# Patient Record
Sex: Female | Born: 1985 | Hispanic: No | Marital: Single | Smoking: Never smoker
Health system: Southern US, Community
[De-identification: ages and names within clinical notes are randomized; demographics above are authoritative.]

## PROBLEM LIST (undated history)

## (undated) DIAGNOSIS — I10 Essential (primary) hypertension: Secondary | ICD-10-CM

## (undated) DIAGNOSIS — D649 Anemia, unspecified: Secondary | ICD-10-CM

## (undated) NOTE — *Deleted (*Deleted)
   09/12/20 0830  Clinical Encounter Type  Visited With Family  Visit Type Follow-up  Referral From Nurse  Consult/Referral To Chaplain  Chaplain called to support the patient's family in the waiting area. Patient's fiance, Justice and sister, Brittany sitting in waiting area. Brittany crying, stated the doctor gave bad news about the patient's condition. Brittany is not accepting that the patient may not survive. Chaplain offered prayer and comfort.This note was prepared by Chaplain Gail Brown, M.Div..  For questions please contact by phone [phone].  

## (undated) NOTE — *Deleted (*Deleted)
NAME:  Glenda Fuller, MRN:  [id number], DOB:  [birth year], LOS: 0 ADMISSION DATE:  09-21-2020, CONSULTATION DATE:  *** REFERRING MD:  ***, CHIEF COMPLAINT:  ***   Brief History   ***  History of present illness   ***  Past Medical History  ***  Significant Hospital Events   ***  Consults:  ***  Procedures:  ***  Significant Diagnostic Tests:  ***  Micro Data:  ***  Antimicrobials:  ***   Interim history/subjective:  ***  Objective   Blood pressure (!) 202/96, pulse 79, temperature 98.6 F (37 C), temperature source Oral, resp. rate 13, height 5' (1.524 m), weight 39.1 kg, last menstrual period 07/30/2020, SpO2 100 %.        Intake/Output Summary (Last 24 hours) at 09/21/20 2046 Last data filed at September 21, 2020 1826 Gross per 24 hour  Intake 1001.6 ml  Output -  Net 1001.6 ml   Filed Weights   2020/09/21 1508  Weight: 39.1 kg    Examination: General: *** HENT: *** Lungs: *** Cardiovascular: *** Abdomen: *** Extremities: *** Neuro: *** GU: ***  Resolved Hospital Problem list   ***  Assessment & Plan:  ***  Best practice:  Diet: *** Pain/Anxiety/Delirium protocol (if indicated): *** VAP protocol (if indicated): *** DVT prophylaxis: *** GI prophylaxis: *** Glucose control: *** Mobility: *** Code Status: *** Family Communication: *** Disposition:   Labs   CBC: Recent Labs  Lab 09-21-2020 1506  WBC 9.0  HGB 5.4*  HCT 16.1*  MCV 86.1  PLT 237    Basic Metabolic Panel: Recent Labs  Lab Sep 21, 2020 1506  NA 131*  K 3.3*  CL 94*  CO2 17*  GLUCOSE 114*  BUN 118*  CREATININE 10.12*  CALCIUM 9.1   GFR: Estimated Creatinine Clearance: 4.8 mL/min (A) (by C-G formula based on SCr of 10.12 mg/dL (H)). Recent Labs  Lab 09/21/2020 1506  WBC 9.0    Liver Function Tests: Recent Labs  Lab September 21, 2020 1506  AST 18  ALT 35  ALKPHOS 96  BILITOT 1.0  PROT 7.2  ALBUMIN 3.3*   Recent Labs  Lab 09-21-20 1506  LIPASE 57*   No  results for input(s): AMMONIA in the last 168 hours.  ABG No results found for: PHART, PCO2ART, PO2ART, HCO3, TCO2, ACIDBASEDEF, O2SAT   Coagulation Profile: No results for input(s): INR, PROTIME in the last 168 hours.  Cardiac Enzymes: No results for input(s): CKTOTAL, CKMB, CKMBINDEX, TROPONINI in the last 168 hours.  HbA1C: No results found for: HGBA1C  CBG: No results for input(s): GLUCAP in the last 168 hours.  Review of Systems:   ***  Past Medical History  She,  has a past medical history of Anemia and Hypertension.   Surgical History   History reviewed. No pertinent surgical history.   Social History   reports that she has never smoked. She does not have any smokeless tobacco history on file. She reports previous alcohol use. She reports previous drug use.   Family History   Her family history is not on file.   Allergies Not on File   Home Medications  Prior to Admission medications   Medication Sig Start Date End Date Taking? Authorizing Provider  amLODipine (NORVASC) 5 MG tablet Take by mouth. 09/21/2020 10/09/20  [provider]  ferrous sulfate 325 (65 FE) MG tablet Take by mouth.    [provider]  methocarbamol (ROBAXIN) 500 MG tablet Take by mouth. 09-21-2020 09/16/20  [provider]  nabumetone (  RELAFEN) 750 MG tablet Take by mouth. 2020-10-06 09/23/20  [provider]  traMADol Janean Sark) 50 MG tablet Take by mouth. 10-06-20 09/14/20  [provider]     Critical care time: ***

---

## 2020-09-09 ENCOUNTER — Other Ambulatory Visit: Payer: Self-pay

## 2020-09-09 ENCOUNTER — Emergency Department (HOSPITAL_BASED_OUTPATIENT_CLINIC_OR_DEPARTMENT_OTHER): Payer: Medicaid Other

## 2020-09-09 ENCOUNTER — Encounter (HOSPITAL_COMMUNITY): Admission: EM | Disposition: E | Payer: Self-pay | Source: Home / Self Care | Attending: Pulmonary Disease

## 2020-09-09 ENCOUNTER — Encounter (HOSPITAL_BASED_OUTPATIENT_CLINIC_OR_DEPARTMENT_OTHER): Payer: Self-pay | Admitting: Emergency Medicine

## 2020-09-09 ENCOUNTER — Inpatient Hospital Stay (HOSPITAL_COMMUNITY): Payer: Medicaid Other

## 2020-09-09 ENCOUNTER — Inpatient Hospital Stay (HOSPITAL_BASED_OUTPATIENT_CLINIC_OR_DEPARTMENT_OTHER)
Admission: EM | Admit: 2020-09-09 | Discharge: 2020-09-25 | DRG: 219 | Disposition: E | Payer: Medicaid Other | Source: Ambulatory Visit | Attending: Pulmonary Disease | Admitting: Pulmonary Disease

## 2020-09-09 ENCOUNTER — Inpatient Hospital Stay (HOSPITAL_COMMUNITY): Payer: Medicaid Other | Admitting: Anesthesiology

## 2020-09-09 DIAGNOSIS — E871 Hypo-osmolality and hyponatremia: Secondary | ICD-10-CM | POA: Diagnosis not present

## 2020-09-09 DIAGNOSIS — Z4659 Encounter for fitting and adjustment of other gastrointestinal appliance and device: Secondary | ICD-10-CM

## 2020-09-09 DIAGNOSIS — D649 Anemia, unspecified: Secondary | ICD-10-CM | POA: Diagnosis present

## 2020-09-09 DIAGNOSIS — K661 Hemoperitoneum: Secondary | ICD-10-CM | POA: Diagnosis present

## 2020-09-09 DIAGNOSIS — Z66 Do not resuscitate: Secondary | ICD-10-CM | POA: Diagnosis not present

## 2020-09-09 DIAGNOSIS — T465X6A Underdosing of other antihypertensive drugs, initial encounter: Secondary | ICD-10-CM | POA: Diagnosis present

## 2020-09-09 DIAGNOSIS — I7101 Dissection of thoracic aorta: Principal | ICD-10-CM | POA: Diagnosis present

## 2020-09-09 DIAGNOSIS — G9389 Other specified disorders of brain: Secondary | ICD-10-CM | POA: Diagnosis not present

## 2020-09-09 DIAGNOSIS — E781 Pure hyperglyceridemia: Secondary | ICD-10-CM | POA: Diagnosis not present

## 2020-09-09 DIAGNOSIS — I161 Hypertensive emergency: Secondary | ICD-10-CM | POA: Diagnosis present

## 2020-09-09 DIAGNOSIS — R739 Hyperglycemia, unspecified: Secondary | ICD-10-CM | POA: Diagnosis not present

## 2020-09-09 DIAGNOSIS — R140 Abdominal distension (gaseous): Secondary | ICD-10-CM | POA: Diagnosis not present

## 2020-09-09 DIAGNOSIS — I609 Nontraumatic subarachnoid hemorrhage, unspecified: Secondary | ICD-10-CM | POA: Diagnosis not present

## 2020-09-09 DIAGNOSIS — R778 Other specified abnormalities of plasma proteins: Secondary | ICD-10-CM | POA: Diagnosis present

## 2020-09-09 DIAGNOSIS — Z515 Encounter for palliative care: Secondary | ICD-10-CM | POA: Diagnosis not present

## 2020-09-09 DIAGNOSIS — Z91138 Patient's unintentional underdosing of medication regimen for other reason: Secondary | ICD-10-CM

## 2020-09-09 DIAGNOSIS — I7103 Dissection of thoracoabdominal aorta: Secondary | ICD-10-CM | POA: Diagnosis not present

## 2020-09-09 DIAGNOSIS — N170 Acute kidney failure with tubular necrosis: Secondary | ICD-10-CM | POA: Diagnosis not present

## 2020-09-09 DIAGNOSIS — I615 Nontraumatic intracerebral hemorrhage, intraventricular: Secondary | ICD-10-CM | POA: Diagnosis not present

## 2020-09-09 DIAGNOSIS — D62 Acute posthemorrhagic anemia: Secondary | ICD-10-CM | POA: Diagnosis present

## 2020-09-09 DIAGNOSIS — R34 Anuria and oliguria: Secondary | ICD-10-CM | POA: Diagnosis not present

## 2020-09-09 DIAGNOSIS — I614 Nontraumatic intracerebral hemorrhage in cerebellum: Secondary | ICD-10-CM | POA: Diagnosis not present

## 2020-09-09 DIAGNOSIS — M7981 Nontraumatic hematoma of soft tissue: Secondary | ICD-10-CM | POA: Diagnosis present

## 2020-09-09 DIAGNOSIS — Z95828 Presence of other vascular implants and grafts: Secondary | ICD-10-CM | POA: Diagnosis not present

## 2020-09-09 DIAGNOSIS — E872 Acidosis: Secondary | ICD-10-CM | POA: Diagnosis not present

## 2020-09-09 DIAGNOSIS — R578 Other shock: Secondary | ICD-10-CM | POA: Diagnosis not present

## 2020-09-09 DIAGNOSIS — J90 Pleural effusion, not elsewhere classified: Secondary | ICD-10-CM | POA: Diagnosis not present

## 2020-09-09 DIAGNOSIS — G96810 Intracranial hypotension, unspecified: Secondary | ICD-10-CM | POA: Diagnosis not present

## 2020-09-09 DIAGNOSIS — G911 Obstructive hydrocephalus: Secondary | ICD-10-CM | POA: Diagnosis not present

## 2020-09-09 DIAGNOSIS — Z978 Presence of other specified devices: Secondary | ICD-10-CM | POA: Diagnosis not present

## 2020-09-09 DIAGNOSIS — G9349 Other encephalopathy: Secondary | ICD-10-CM | POA: Diagnosis not present

## 2020-09-09 DIAGNOSIS — Z781 Physical restraint status: Secondary | ICD-10-CM

## 2020-09-09 DIAGNOSIS — M79A3 Nontraumatic compartment syndrome of abdomen: Secondary | ICD-10-CM | POA: Diagnosis not present

## 2020-09-09 DIAGNOSIS — Z7189 Other specified counseling: Secondary | ICD-10-CM | POA: Diagnosis not present

## 2020-09-09 DIAGNOSIS — N179 Acute kidney failure, unspecified: Secondary | ICD-10-CM | POA: Diagnosis not present

## 2020-09-09 DIAGNOSIS — I9789 Other postprocedural complications and disorders of the circulatory system, not elsewhere classified: Secondary | ICD-10-CM | POA: Diagnosis not present

## 2020-09-09 DIAGNOSIS — G936 Cerebral edema: Secondary | ICD-10-CM | POA: Diagnosis not present

## 2020-09-09 DIAGNOSIS — R4182 Altered mental status, unspecified: Secondary | ICD-10-CM | POA: Diagnosis not present

## 2020-09-09 DIAGNOSIS — Z0189 Encounter for other specified special examinations: Secondary | ICD-10-CM

## 2020-09-09 DIAGNOSIS — Z452 Encounter for adjustment and management of vascular access device: Secondary | ICD-10-CM

## 2020-09-09 DIAGNOSIS — R188 Other ascites: Secondary | ICD-10-CM | POA: Diagnosis not present

## 2020-09-09 DIAGNOSIS — J9601 Acute respiratory failure with hypoxia: Secondary | ICD-10-CM | POA: Diagnosis not present

## 2020-09-09 DIAGNOSIS — N19 Unspecified kidney failure: Secondary | ICD-10-CM

## 2020-09-09 DIAGNOSIS — I6200 Nontraumatic subdural hemorrhage, unspecified: Secondary | ICD-10-CM | POA: Diagnosis not present

## 2020-09-09 DIAGNOSIS — Z20822 Contact with and (suspected) exposure to covid-19: Secondary | ICD-10-CM | POA: Diagnosis present

## 2020-09-09 DIAGNOSIS — G8220 Paraplegia, unspecified: Secondary | ICD-10-CM | POA: Diagnosis not present

## 2020-09-09 DIAGNOSIS — Z01818 Encounter for other preprocedural examination: Secondary | ICD-10-CM

## 2020-09-09 DIAGNOSIS — G9511 Acute infarction of spinal cord (embolic) (nonembolic): Secondary | ICD-10-CM | POA: Diagnosis not present

## 2020-09-09 DIAGNOSIS — E876 Hypokalemia: Secondary | ICD-10-CM | POA: Diagnosis present

## 2020-09-09 DIAGNOSIS — Z8249 Family history of ischemic heart disease and other diseases of the circulatory system: Secondary | ICD-10-CM

## 2020-09-09 DIAGNOSIS — H5704 Mydriasis: Secondary | ICD-10-CM | POA: Diagnosis not present

## 2020-09-09 DIAGNOSIS — I619 Nontraumatic intracerebral hemorrhage, unspecified: Secondary | ICD-10-CM | POA: Diagnosis not present

## 2020-09-09 DIAGNOSIS — E162 Hypoglycemia, unspecified: Secondary | ICD-10-CM | POA: Diagnosis not present

## 2020-09-09 DIAGNOSIS — I10 Essential (primary) hypertension: Secondary | ICD-10-CM | POA: Diagnosis present

## 2020-09-09 DIAGNOSIS — I712 Thoracic aortic aneurysm, without rupture: Secondary | ICD-10-CM | POA: Diagnosis not present

## 2020-09-09 HISTORY — DX: Essential (primary) hypertension: I10

## 2020-09-09 HISTORY — DX: Anemia, unspecified: D64.9

## 2020-09-09 HISTORY — PX: THORACIC AORTIC ENDOVASCULAR STENT GRAFT: SHX6112

## 2020-09-09 LAB — CBC
HCT: 16.1 % — ABNORMAL LOW (ref 36.0–46.0)
Hemoglobin: 5.4 g/dL — CL (ref 12.0–15.0)
MCH: 28.9 pg (ref 26.0–34.0)
MCHC: 33.5 g/dL (ref 30.0–36.0)
MCV: 86.1 fL (ref 80.0–100.0)
Platelets: 237 10*3/uL (ref 150–400)
RBC: 1.87 MIL/uL — ABNORMAL LOW (ref 3.87–5.11)
RDW: 15.4 % (ref 11.5–15.5)
WBC: 9 10*3/uL (ref 4.0–10.5)
nRBC: 0 % (ref 0.0–0.2)

## 2020-09-09 LAB — URINALYSIS, ROUTINE W REFLEX MICROSCOPIC
Bilirubin Urine: NEGATIVE
Glucose, UA: NEGATIVE mg/dL
Ketones, ur: NEGATIVE mg/dL
Leukocytes,Ua: NEGATIVE
Nitrite: NEGATIVE
Protein, ur: 30 mg/dL — AB
Specific Gravity, Urine: 1.02 (ref 1.005–1.030)
pH: 5.5 (ref 5.0–8.0)

## 2020-09-09 LAB — PREGNANCY, URINE: Preg Test, Ur: NEGATIVE

## 2020-09-09 LAB — HEPATIC FUNCTION PANEL
ALT: 35 U/L (ref 0–44)
AST: 18 U/L (ref 15–41)
Albumin: 3.3 g/dL — ABNORMAL LOW (ref 3.5–5.0)
Alkaline Phosphatase: 96 U/L (ref 38–126)
Bilirubin, Direct: <0.1 mg/dL (ref 0.0–0.2)
Total Bilirubin: 1 mg/dL (ref 0.3–1.2)
Total Protein: 7.2 g/dL (ref 6.5–8.1)

## 2020-09-09 LAB — BASIC METABOLIC PANEL
Anion gap: 20 — ABNORMAL HIGH (ref 5–15)
BUN: 118 mg/dL — ABNORMAL HIGH (ref 6–20)
CO2: 17 mmol/L — ABNORMAL LOW (ref 22–32)
Calcium: 9.1 mg/dL (ref 8.9–10.3)
Chloride: 94 mmol/L — ABNORMAL LOW (ref 98–111)
Creatinine, Ser: 10.12 mg/dL — ABNORMAL HIGH (ref 0.44–1.00)
GFR, Estimated: 4 mL/min — ABNORMAL LOW (ref >60)
Glucose, Bld: 114 mg/dL — ABNORMAL HIGH (ref 70–99)
Potassium: 3.3 mmol/L — ABNORMAL LOW (ref 3.5–5.1)
Sodium: 131 mmol/L — ABNORMAL LOW (ref 135–145)

## 2020-09-09 LAB — URINALYSIS, MICROSCOPIC (REFLEX)

## 2020-09-09 LAB — OCCULT BLOOD X 1 CARD TO LAB, STOOL: Fecal Occult Bld: NEGATIVE

## 2020-09-09 LAB — PREPARE RBC (CROSSMATCH)

## 2020-09-09 LAB — RETICULOCYTES
Immature Retic Fract: 22.1 % — ABNORMAL HIGH (ref 2.3–15.9)
RBC.: 1.94 MIL/uL — ABNORMAL LOW (ref 3.87–5.11)
Retic Count, Absolute: 55.7 10*3/uL (ref 19.0–186.0)
Retic Ct Pct: 2.8 % (ref 0.4–3.1)

## 2020-09-09 LAB — LIPASE, BLOOD: Lipase: 57 U/L — ABNORMAL HIGH (ref 11–51)

## 2020-09-09 LAB — ABO/RH: ABO/RH(D): B POS

## 2020-09-09 LAB — RESPIRATORY PANEL BY RT PCR (FLU A&B, COVID)
Influenza A by PCR: NEGATIVE
Influenza B by PCR: NEGATIVE
SARS Coronavirus 2 by RT PCR: NEGATIVE

## 2020-09-09 LAB — TROPONIN I (HIGH SENSITIVITY)
Troponin I (High Sensitivity): 338 ng/L (ref <18)
Troponin I (High Sensitivity): 319 ng/L (ref <18)

## 2020-09-09 SURGERY — INSERTION, ENDOVASCULAR STENT GRAFT, AORTA, THORACIC
Anesthesia: General

## 2020-09-09 MED ORDER — FENTANYL CITRATE (PF) 100 MCG/2ML IJ SOLN: 25.0000 ug | INTRAMUSCULAR | Status: DC | PRN

## 2020-09-09 MED ORDER — PROMETHAZINE HCL 25 MG/ML IJ SOLN
6.2500 mg | INTRAMUSCULAR | Status: AC | PRN
  Administered 2020-09-10: 6.25 mg via INTRAVENOUS
  Administered 2020-09-10: 12.5 mg via INTRAVENOUS

## 2020-09-09 MED ORDER — CLEVIDIPINE BUTYRATE 0.5 MG/ML IV EMUL
0.0000 mg/h | INTRAVENOUS | Status: DC
  Administered 2020-09-09: 18 mg/h via INTRAVENOUS
  Administered 2020-09-10: 21 mg/h via INTRAVENOUS
  Administered 2020-09-10: 16 mg/h via INTRAVENOUS
  Administered 2020-09-10 (×3): 21 mg/h via INTRAVENOUS
  Administered 2020-09-10: 14 mg/h via INTRAVENOUS
  Administered 2020-09-10 (×2): 21 mg/h via INTRAVENOUS
  Administered 2020-09-10: 20 mg/h via INTRAVENOUS
  Administered 2020-09-10: 21 mg/h via INTRAVENOUS
  Administered 2020-09-11: 12 mg/h via INTRAVENOUS
  Administered 2020-09-11: 21 mg/h via INTRAVENOUS
  Filled 2020-09-09: qty 100
  Filled 2020-09-09: qty 50
  Filled 2020-09-09 (×9): qty 100
  Filled 2020-09-09: qty 50
  Filled 2020-09-09 (×3): qty 100
  Filled 2020-09-09: qty 50
  Filled 2020-09-09: qty 100

## 2020-09-09 MED ORDER — LABETALOL HCL 5 MG/ML IV SOLN
5.0000 mg | Freq: Once | INTRAVENOUS | Status: AC
  Administered 2020-09-09: 5 mg via INTRAVENOUS
  Filled 2020-09-09: qty 4

## 2020-09-09 MED ORDER — INSULIN REGULAR(HUMAN) IN NACL 100-0.9 UT/100ML-% IV SOLN
INTRAVENOUS | Status: DC
  Filled 2020-09-09: qty 100

## 2020-09-09 MED ORDER — MANNITOL 20 % IV SOLN
Freq: Once | INTRAVENOUS | Status: DC
  Filled 2020-09-09: qty 13

## 2020-09-09 MED ORDER — SODIUM CHLORIDE 0.9 % IV SOLN
INTRAVENOUS | Status: DC | PRN
  Administered 2020-09-09: 500 mL

## 2020-09-09 MED ORDER — IODIXANOL 320 MG/ML IV SOLN
INTRAVENOUS | Status: DC | PRN
  Administered 2020-09-09: 25 mL via INTRA_ARTERIAL

## 2020-09-09 MED ORDER — TRANEXAMIC ACID 1000 MG/10ML IV SOLN
1.5000 mg/kg/h | INTRAVENOUS | Status: DC
  Filled 2020-09-09: qty 25

## 2020-09-09 MED ORDER — DOCUSATE SODIUM 100 MG PO CAPS: 100.0000 mg | ORAL_CAPSULE | Freq: Two times a day (BID) | ORAL | Status: DC | PRN

## 2020-09-09 MED ORDER — POTASSIUM CHLORIDE 2 MEQ/ML IV SOLN
80.0000 meq | INTRAVENOUS | Status: DC
  Filled 2020-09-09: qty 40

## 2020-09-09 MED ORDER — HEPARIN SODIUM (PORCINE) 1000 UNIT/ML IJ SOLN
INTRAMUSCULAR | Status: DC | PRN
  Administered 2020-09-09: 2000 [IU] via INTRAVENOUS
  Administered 2020-09-10: 4000 [IU] via INTRAVENOUS

## 2020-09-09 MED ORDER — ONDANSETRON HCL 4 MG/2ML IJ SOLN
INTRAMUSCULAR | Status: AC
  Filled 2020-09-09: qty 2

## 2020-09-09 MED ORDER — HYDROMORPHONE HCL 1 MG/ML IJ SOLN: 0.2500 mg | INTRAMUSCULAR | Status: DC | PRN

## 2020-09-09 MED ORDER — PHENYLEPHRINE HCL-NACL 20-0.9 MG/250ML-% IV SOLN
30.0000 ug/min | INTRAVENOUS | Status: DC
  Filled 2020-09-09: qty 250

## 2020-09-09 MED ORDER — DEXMEDETOMIDINE HCL IN NACL 400 MCG/100ML IV SOLN
0.1000 ug/kg/h | INTRAVENOUS | Status: DC
  Filled 2020-09-09: qty 100

## 2020-09-09 MED ORDER — SODIUM CHLORIDE 0.9 % IV SOLN
750.0000 mg | INTRAVENOUS | Status: DC
  Filled 2020-09-09 (×2): qty 750

## 2020-09-09 MED ORDER — PROPOFOL 10 MG/ML IV BOLUS
INTRAVENOUS | Status: DC | PRN
  Administered 2020-09-09: 90 mg via INTRAVENOUS

## 2020-09-09 MED ORDER — ESMOLOL HCL-SODIUM CHLORIDE 2000 MG/100ML IV SOLN
25.0000 ug/kg/min | INTRAVENOUS | Status: DC
  Administered 2020-09-09: 25 ug/kg/min via INTRAVENOUS
  Administered 2020-09-10: 200 ug/kg/min via INTRAVENOUS
  Administered 2020-09-10: 75 ug/kg/min via INTRAVENOUS
  Filled 2020-09-09 (×5): qty 100

## 2020-09-09 MED ORDER — SODIUM CHLORIDE 0.9 % IV SOLN
INTRAVENOUS | Status: DC
  Filled 2020-09-09: qty 30

## 2020-09-09 MED ORDER — NITROGLYCERIN IN D5W 200-5 MCG/ML-% IV SOLN
2.0000 ug/min | INTRAVENOUS | Status: DC
  Filled 2020-09-09: qty 250

## 2020-09-09 MED ORDER — HYDROMORPHONE HCL 1 MG/ML IJ SOLN: 1.0000 mg | Freq: Once | INTRAMUSCULAR | Status: AC

## 2020-09-09 MED ORDER — EPINEPHRINE HCL 5 MG/250ML IV SOLN IN NS
0.0000 ug/min | INTRAVENOUS | Status: DC
  Filled 2020-09-09: qty 250

## 2020-09-09 MED ORDER — LIDOCAINE 2% (20 MG/ML) 5 ML SYRINGE
INTRAMUSCULAR | Status: DC | PRN
  Administered 2020-09-09: 20 mg via INTRAVENOUS

## 2020-09-09 MED ORDER — CLEVIDIPINE BUTYRATE 0.5 MG/ML IV EMUL
INTRAVENOUS | Status: AC
  Administered 2020-09-09: 1 mg/h via INTRAVENOUS
  Filled 2020-09-09: qty 50

## 2020-09-09 MED ORDER — SCOPOLAMINE 1 MG/3DAYS TD PT72
MEDICATED_PATCH | TRANSDERMAL | Status: DC | PRN
  Administered 2020-09-09: 1 via TRANSDERMAL

## 2020-09-09 MED ORDER — ONDANSETRON HCL 4 MG/2ML IJ SOLN
4.0000 mg | Freq: Once | INTRAMUSCULAR | Status: AC
  Administered 2020-09-09: 4 mg via INTRAVENOUS

## 2020-09-09 MED ORDER — CEFAZOLIN SODIUM-DEXTROSE 2-3 GM-%(50ML) IV SOLR
INTRAVENOUS | Status: DC | PRN
  Administered 2020-09-09: 2 g via INTRAVENOUS

## 2020-09-09 MED ORDER — TRANEXAMIC ACID (OHS) BOLUS VIA INFUSION
15.0000 mg/kg | INTRAVENOUS | Status: DC
  Filled 2020-09-09: qty 587

## 2020-09-09 MED ORDER — HYDROMORPHONE HCL 1 MG/ML IJ SOLN
INTRAMUSCULAR | Status: AC
  Administered 2020-09-09: 1 mg via INTRAVENOUS
  Filled 2020-09-09: qty 1

## 2020-09-09 MED ORDER — FENTANYL CITRATE (PF) 250 MCG/5ML IJ SOLN
INTRAMUSCULAR | Status: AC
  Filled 2020-09-09: qty 5

## 2020-09-09 MED ORDER — SODIUM BICARBONATE 8.4 % IV SOLN
INTRAVENOUS | Status: DC | PRN
  Administered 2020-09-09: 50 meq via INTRAVENOUS

## 2020-09-09 MED ORDER — MILRINONE LACTATE IN DEXTROSE 20-5 MG/100ML-% IV SOLN
0.3000 ug/kg/min | INTRAVENOUS | Status: DC
  Filled 2020-09-09: qty 100

## 2020-09-09 MED ORDER — POLYETHYLENE GLYCOL 3350 17 G PO PACK: 17.0000 g | PACK | Freq: Every day | ORAL | Status: DC | PRN

## 2020-09-09 MED ORDER — MIDAZOLAM HCL 5 MG/5ML IJ SOLN
INTRAMUSCULAR | Status: DC | PRN
  Administered 2020-09-09: 1 mg via INTRAVENOUS

## 2020-09-09 MED ORDER — MIDAZOLAM HCL 2 MG/2ML IJ SOLN
INTRAMUSCULAR | Status: AC
  Filled 2020-09-09: qty 2

## 2020-09-09 MED ORDER — NOREPINEPHRINE 4 MG/250ML-% IV SOLN
0.0000 ug/min | INTRAVENOUS | Status: DC
  Filled 2020-09-09: qty 250

## 2020-09-09 MED ORDER — DEXAMETHASONE SODIUM PHOSPHATE 10 MG/ML IJ SOLN
INTRAMUSCULAR | Status: DC | PRN
  Administered 2020-09-09: 10 mg via INTRAVENOUS

## 2020-09-09 MED ORDER — ROCURONIUM BROMIDE 10 MG/ML (PF) SYRINGE
PREFILLED_SYRINGE | INTRAVENOUS | Status: DC | PRN
  Administered 2020-09-09 (×2): 40 mg via INTRAVENOUS

## 2020-09-09 MED ORDER — SODIUM CHLORIDE 0.9 % IV SOLN
INTRAVENOUS | Status: AC
  Filled 2020-09-09: qty 1.2

## 2020-09-09 MED ORDER — SCOPOLAMINE 1 MG/3DAYS TD PT72
MEDICATED_PATCH | TRANSDERMAL | Status: AC
  Filled 2020-09-09: qty 1

## 2020-09-09 MED ORDER — MAGNESIUM SULFATE 50 % IJ SOLN
40.0000 meq | INTRAMUSCULAR | Status: DC
  Filled 2020-09-09: qty 9.85

## 2020-09-09 MED ORDER — PLASMA-LYTE 148 IV SOLN
INTRAVENOUS | Status: DC
  Filled 2020-09-09: qty 2.5

## 2020-09-09 MED ORDER — VANCOMYCIN HCL 1250 MG/250ML IV SOLN
1250.0000 mg | INTRAVENOUS | Status: DC
  Filled 2020-09-09 (×2): qty 250

## 2020-09-09 MED ORDER — SODIUM CHLORIDE 0.9 % IV SOLN
1.5000 g | INTRAVENOUS | Status: DC
  Filled 2020-09-09: qty 1.5

## 2020-09-09 MED ORDER — IOHEXOL 350 MG/ML SOLN
100.0000 mL | Freq: Once | INTRAVENOUS | Status: AC | PRN
  Administered 2020-09-09: 100 mL via INTRAVENOUS

## 2020-09-09 MED ORDER — SODIUM CHLORIDE 0.9 % IV SOLN: INTRAVENOUS | Status: DC | PRN

## 2020-09-09 MED ORDER — FENTANYL CITRATE (PF) 100 MCG/2ML IJ SOLN
INTRAMUSCULAR | Status: DC | PRN
Start: 2020-09-09 — End: 2020-09-10
  Administered 2020-09-09: 100 ug via INTRAVENOUS

## 2020-09-09 MED ORDER — LABETALOL HCL 5 MG/ML IV SOLN: 5.0000 mg | Freq: Once | INTRAVENOUS | Status: DC

## 2020-09-09 MED ORDER — SODIUM CHLORIDE 0.9 % IV SOLN
INTRAVENOUS | Status: DC | PRN
  Administered 2020-09-09 – 2020-09-10 (×2): 500 mL via INTRAVENOUS
  Administered 2020-09-11: 250 mL via INTRAVENOUS

## 2020-09-09 MED ORDER — SODIUM CHLORIDE 0.9 % IV SOLN: 10.0000 mL/h | Freq: Once | INTRAVENOUS | Status: DC

## 2020-09-09 MED ORDER — ONDANSETRON HCL 4 MG/2ML IJ SOLN
4.0000 mg | Freq: Four times a day (QID) | INTRAMUSCULAR | Status: DC | PRN
  Administered 2020-09-10: 4 mg via INTRAVENOUS
  Filled 2020-09-09: qty 2

## 2020-09-09 MED ORDER — TRANEXAMIC ACID (OHS) PUMP PRIME SOLUTION
2.0000 mg/kg | INTRAVENOUS | Status: DC
  Filled 2020-09-09: qty 0.78

## 2020-09-09 MED ORDER — 0.9 % SODIUM CHLORIDE (POUR BTL) OPTIME
TOPICAL | Status: DC | PRN
  Administered 2020-09-09: 1000 mL

## 2020-09-09 MED ORDER — FENTANYL CITRATE (PF) 100 MCG/2ML IJ SOLN
50.0000 ug | Freq: Once | INTRAMUSCULAR | Status: AC
  Administered 2020-09-09: 50 ug via INTRAVENOUS
  Filled 2020-09-09: qty 2

## 2020-09-09 MED ORDER — SODIUM CHLORIDE 0.9 % IV BOLUS
1000.0000 mL | Freq: Once | INTRAVENOUS | Status: AC
  Administered 2020-09-09: 1000 mL via INTRAVENOUS

## 2020-09-09 SURGICAL SUPPLY — 73 items
CANISTER SUCT 3000ML PPV (MISCELLANEOUS) ×2 IMPLANT
CATH ACCU-VU SIZ PIG 5F 100CM (CATHETERS) ×2 IMPLANT
CATH ANGIO 5F BER2 100CM (CATHETERS) ×2 IMPLANT
CATH VENTRIC 35X38 W/TROCAR LG (CATHETERS) ×2 IMPLANT
CATH VISIONS PV .035 IVUS (CATHETERS) ×2 IMPLANT
CLIP VESOCCLUDE MED 24/CT (CLIP) ×2 IMPLANT
CLIP VESOCCLUDE SM WIDE 24/CT (CLIP) ×2 IMPLANT
COVER DOME SNAP 22 D (MISCELLANEOUS) ×2 IMPLANT
COVER PROBE W GEL 5X96 (DRAPES) ×2 IMPLANT
COVER WAND RF STERILE (DRAPES) IMPLANT
DERMABOND ADVANCED (GAUZE/BANDAGES/DRESSINGS) ×1
DERMABOND ADVANCED .7 DNX12 (GAUZE/BANDAGES/DRESSINGS) ×1 IMPLANT
DEVICE CLOSURE PERCLS PRGLD 6F (VASCULAR PRODUCTS) ×4 IMPLANT
DEVICE TORQUE KENDALL .025-038 (MISCELLANEOUS) ×2 IMPLANT
DRSG TEGADERM 2-3/8X2-3/4 SM (GAUZE/BANDAGES/DRESSINGS) ×2 IMPLANT
DRSG TEGADERM 4X4.75 (GAUZE/BANDAGES/DRESSINGS) ×2 IMPLANT
DRYSEAL FLEXSHEATH 20FR 33CM (SHEATH) ×1
ELECT REM PT RETURN 9FT ADLT (ELECTROSURGICAL) ×4
ELECTRODE REM PT RTRN 9FT ADLT (ELECTROSURGICAL) ×2 IMPLANT
GLOVE BIO SURGEON STRL SZ 6.5 (GLOVE) ×4 IMPLANT
GLOVE BIO SURGEON STRL SZ7.5 (GLOVE) ×4 IMPLANT
GLOVE BIOGEL PI IND STRL 6.5 (GLOVE) ×3 IMPLANT
GLOVE BIOGEL PI IND STRL 7.0 (GLOVE) ×2 IMPLANT
GLOVE BIOGEL PI IND STRL 8 (GLOVE) ×1 IMPLANT
GLOVE BIOGEL PI INDICATOR 6.5 (GLOVE) ×3
GLOVE BIOGEL PI INDICATOR 7.0 (GLOVE) ×2
GLOVE BIOGEL PI INDICATOR 8 (GLOVE) ×1
GLOVE SURG SS PI 7.5 STRL IVOR (GLOVE) ×2 IMPLANT
GOWN STRL REUS W/ TWL LRG LVL3 (GOWN DISPOSABLE) ×3 IMPLANT
GOWN STRL REUS W/ TWL XL LVL3 (GOWN DISPOSABLE) ×2 IMPLANT
GOWN STRL REUS W/TWL LRG LVL3 (GOWN DISPOSABLE) ×3
GOWN STRL REUS W/TWL XL LVL3 (GOWN DISPOSABLE) ×2
GUIDEWIRE ANGLED .035X150CM (WIRE) ×2 IMPLANT
KIT BASIN OR (CUSTOM PROCEDURE TRAY) ×2 IMPLANT
KIT DRAIN CSF ACCUDRAIN (MISCELLANEOUS) ×2 IMPLANT
KIT TURNOVER KIT B (KITS) ×2 IMPLANT
LOOP VESSEL MAXI BLUE (MISCELLANEOUS) ×2 IMPLANT
LOOP VESSEL MINI RED (MISCELLANEOUS) ×2 IMPLANT
NEEDLE PERC 18GX7CM (NEEDLE) ×2 IMPLANT
NS IRRIG 1000ML POUR BTL (IV SOLUTION) ×4 IMPLANT
PACK ENDOVASCULAR (PACKS) ×2 IMPLANT
PAD ARMBOARD 7.5X6 YLW CONV (MISCELLANEOUS) ×4 IMPLANT
PENCIL BUTTON HOLSTER BLD 10FT (ELECTRODE) ×2 IMPLANT
PERCLOSE PROGLIDE 6F (VASCULAR PRODUCTS) ×8
SET MICROPUNCTURE 5F STIFF (MISCELLANEOUS) ×2 IMPLANT
SHEATH AVANTI 11CM 8FR (SHEATH) IMPLANT
SHEATH DRYSEAL FLEX 20FR 33CM (SHEATH) ×1 IMPLANT
SHEATH PINNACLE 8F 10CM (SHEATH) ×2 IMPLANT
SLEEVE ISOL F/PACE RF HD COVER (MISCELLANEOUS) ×2 IMPLANT
STAPLER VISISTAT 35W (STAPLE) IMPLANT
STENT GRFT THORAC ACS 31X31X20 (Endovascular Graft) ×2 IMPLANT
STOPCOCK MORSE 400PSI 3WAY (MISCELLANEOUS) ×2 IMPLANT
SUT ETHILON 3 0 PS 1 (SUTURE) IMPLANT
SUT MNCRL AB 4-0 PS2 18 (SUTURE) ×4 IMPLANT
SUT PROLENE 5 0 C 1 24 (SUTURE) IMPLANT
SUT SILK 2 0 (SUTURE) ×1
SUT SILK 2-0 18XBRD TIE 12 (SUTURE) ×1 IMPLANT
SUT SILK 3 0 (SUTURE) ×1
SUT SILK 3-0 18XBRD TIE 12 (SUTURE) ×1 IMPLANT
SUT SILK 4 0 (SUTURE) ×1
SUT SILK 4-0 18XBRD TIE 12 (SUTURE) ×1 IMPLANT
SUT VIC AB 2-0 CTX 36 (SUTURE) IMPLANT
SUT VIC AB 3-0 SH 27 (SUTURE)
SUT VIC AB 3-0 SH 27X BRD (SUTURE) IMPLANT
SYR 30ML LL (SYRINGE) IMPLANT
SYR 50ML LL SCALE MARK (SYRINGE) ×2 IMPLANT
SYR BULB IRRIG 60ML STRL (SYRINGE) ×2 IMPLANT
TOWEL GREEN STERILE (TOWEL DISPOSABLE) ×2 IMPLANT
TRAY FOLEY MTR SLVR 16FR STAT (SET/KITS/TRAYS/PACK) ×2 IMPLANT
TRAY LUMBAR PUNCTURE AD (SET/KITS/TRAYS/PACK) IMPLANT
TUBING HIGH PRESSURE 120CM (CONNECTOR) ×2 IMPLANT
WIRE BENTSON .035X145CM (WIRE) ×2 IMPLANT
WIRE STIFF LUNDERQUIST 260CM (WIRE) ×2 IMPLANT

## 2020-09-09 NOTE — ED Notes (Signed)
ED Provider at bedside. 

## 2020-09-09 NOTE — ED Notes (Signed)
Patient transported to CT 

## 2020-09-09 NOTE — ED Triage Notes (Signed)
Pt arrives with driver pov endorsing being sent by UC for low hgb. Pt endorses left side upper back pain, fatigue, shob

## 2020-09-09 NOTE — ED Notes (Signed)
Vascular sx at bedside.

## 2020-09-09 NOTE — H&P (Signed)
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 Pt initially presented to UC for worsening back pain, fatigue and SOB. Back pain began 2 weeks ago while pushing shopping cart, throbbing in nature and initially worsened with movement and improved with rest. 10/16 pain has moved, extending up the mid back. Labs revealed significant anemia as well as significant AKI. A CT renal stone study was obtained which was concerning for possible aortic dissection.CTA chest and pelvis revealed type B aortic dissection with rupture and mediastinal hematoma.  Pt BP 228/116 on presentation to [facility]. Started on esmolol and clevidipine infusions and is to be transferred to [facility] ED for admission to ICU.   Significant [facility] labs:  Hgb 5.4 HCT 16.1, hsTrop 338 Na 131 K 3.3 BUN 118 Cr 10.12 AG 20  Past Medical History  HTN  Significant Hospital Events   10/16 Referred to [facility] ED from UC for anemia. Found to have type B aortic dissection. On Esmolol and clevidipine for SBP > 200   Consults:  Vascular surgery   Procedures:    Significant Diagnostic Tests:  CT renal stone study> suspected aortic dissection originating in chest witn mediastinal hematoma. Hypertrophic LV. 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 Small bilateral pleural effusion, mild pulmonary edema  CTA c/a/p> Moderate hematoma extending from isthmus of aorta nearly to diaphragm. LV hypertrophy. Aortic Dissection beginning at isthmus of aorta and continuing into abdomen. False lumen compresses true lumen. Dissection terminates at livel of aortic bifurcation.  True lumen serves celiac axis, SMA, bilateral renal arteries   Micro Data:  SARS Cov2> neg  Flu A / B> neg  Antimicrobials:    Interim history/subjective:  Transferring to [facility] ED from [facility] ED.  On clevidipine and esmolol for refractory hypertension   States she is feeling anxious  Objective   Blood pressure (!) 202/96, pulse 79, temperature 98.6 F (37 C), temperature source Oral, resp. rate 13, height 5' (1.524 m), weight 39.1 kg, last menstrual period 07/30/2020, SpO2 100 %.        Intake/Output Summary (Last 24 hours) at Sep 14, 2020 2047 Last data filed at September 14, 2020 1826 Gross per 24 hour  Intake 1001.6 ml  Output --  Net 1001.6 ml   Filed Weights   09-14-2020 1508  Weight: 39.1 kg    Examination: General: wdwn thin adult F supine in stretcher NAD HENT: NCAT pink mmm anicteric sclera Lungs: Symmetrical chest expansion even unlabored respirations Cardiovascular: RRR 2+ peripheral pulses. No JVD, no lower extremity edema Abdomen: thin flat ndnt Extremities: No obvious joint deformity no cyanosis or clubbing Neuro: AAOx4 following commands no focal deficit GU: defer  Resolved Hospital Problem list     Assessment & Plan:   Type B aortic dissection Mediastinal Hematoma Hypertensive Emergency, Hx poorly controlled HTN P -SBP goal < 120 , HR goal <60 -continue esmolol and clevidipine. If unable to control, consider adding labetalol  -vascular surgery consult -- going to OR emergently -analgesic control -- favor fentanyl or dilaudid with significant AKI   AKI -suspect in setting of  above P - trend renal indices  - avoid nephrotoxins   Acute anemia P -transfuse for hgb goal >7  -2Units emergent release now -post op CBC  Elevated troponin -suspect demand: hsTrop 319 in setting of significant dissection, acute anemia P -ECG PRN  Hypokalemia-mild Hyponatremia-mild P -check post-op labs, replace PRN -AM BMP + mag   Best practice:  Diet:  NPO Pain/Anxiety/Delirium protocol (if indicated): PRN fent VAP protocol (if indicated): na DVT prophylaxis: no chemical ppx pre op  GI prophylaxis: na Glucose control: monitor Mobility: BR Code Status: Full  Family Communication: pt updated Disposition: ICU after OR  Labs   CBC: Recent Labs  Lab 2020-09-14 1506  WBC 9.0  HGB 5.4*  HCT 16.1*  MCV 86.1  PLT 237    Basic Metabolic Panel: Recent Labs  Lab September 14, 2020 1506  NA 131*  K 3.3*  CL 94*  CO2 17*  GLUCOSE 114*  BUN 118*  CREATININE 10.12*  CALCIUM 9.1   GFR: Estimated Creatinine Clearance: 4.8 mL/min (A) (by C-G formula based on SCr of 10.12 mg/dL (H)). Recent Labs  Lab 14-Sep-2020 1506  WBC 9.0    Liver Function Tests: Recent Labs  Lab 2020/09/14 1506  AST 18  ALT 35  ALKPHOS 96  BILITOT 1.0  PROT 7.2  ALBUMIN 3.3*   Recent Labs  Lab 2020-09-14 1506  LIPASE 57*   No results for input(s): AMMONIA in the last 168 hours.  ABG No results found for: PHART, PCO2ART, PO2ART, HCO3, TCO2, ACIDBASEDEF, O2SAT   Coagulation Profile: No results for input(s): INR, PROTIME in the last 168 hours.  Cardiac Enzymes: No results for input(s): CKTOTAL, CKMB, CKMBINDEX, TROPONINI in the last 168 hours.  HbA1C: No results found for: HGBA1C  CBG: No results for input(s): GLUCAP in the last 168 hours.  Review of Systems:   + back pain + SOB -chest pain -bloody stool - bloody emesis +nausea, vomiting  + fatigue + weakness  -coughing sneezing URI sx  -fever, chills   Past Medical History  She,  has a past medical history of Anemia and Hypertension.   Surgical History   History reviewed. No pertinent surgical history.   Social History   reports that she has never smoked. She does not have any smokeless tobacco history on file. She reports previous alcohol use. She reports previous drug use.   Family History   Her family history is not on file.   Allergies Not on File   Home Medications  Prior to  Admission medications   Medication Sig Start Date End Date Taking? Authorizing Provider  amLODipine (NORVASC) 5 MG tablet Take by mouth. 09-14-2020 10/09/20  [provider]  ferrous sulfate 325 (65 FE) MG tablet Take by mouth.    [provider]  methocarbamol (ROBAXIN) 500 MG tablet Take by mouth. 09-14-2020 09/16/20  [provider]  nabumetone (RELAFEN) 750 MG tablet Take by mouth. 09/14/2020 09/23/20  [provider]  traMADol Janean Sark) 50 MG tablet Take by mouth. 2020-09-14 09/14/20  [provider]     Critical care time: 40 min      Tessie Fass MSN, AGACNP-BC [facility] [phone] If no answer, [phone] 09-14-20, 9:57 PM

## 2020-09-09 NOTE — ED Provider Notes (Addendum)
[facility] EMERGENCY DEPARTMENT Provider Note   CSN: [id number] Arrival date & time: 09/23/2020  1449     History Chief Complaint  Patient presents with  . Abnormal Labs    Glenda Fuller is a 34 y.o. female patient reports she has a distant history anemia otherwise healthy no daily medication use.  She was sent in today by urgent care after low hemoglobin on their blood draw.  Patient reports she went to an urgent care today for lower back pain that began 2 weeks ago when she was pushing some shopping carts.  Pain has been constant since onset moderate intensity aching throb initially it was nonradiating worsened with movement palpation improved with rest.  Pain had moved up to her right mid back which prompted her urgent care visit today.  She reports that she has been feeling increasingly fatigued over the last few weeks as well.  She reports 1 dark bowel movement around 2 weeks ago when she first hurt her back but has had normal bowel movements with light brown stool since then.  Denies fever/chills, fall/trauma, cough/shortness of breath, hemoptysis, abdominal pain, vomiting, diarrhea, hematochezia, vaginal bleeding, extremity swelling/color change or any additional concerns.  HPI     Past Medical History:  Diagnosis Date  . Anemia   . Hypertension     Patient Active Problem List   Diagnosis Date Noted  . Symptomatic anemia 09/12/2020    History reviewed. No pertinent surgical history.   OB History   No obstetric history on file.     History reviewed. No pertinent family history.  Social History   Tobacco Use  . Smoking status: Never Smoker  Substance Use Topics  . Alcohol use: Not Currently  . Drug use: Not Currently    Home Medications Prior to Admission medications   Medication Sig Start Date End Date Taking? Authorizing Provider  amLODipine (NORVASC) 5 MG tablet Take by mouth. 09/12/2020 10/09/20  [provider]  ferrous sulfate 325  (65 FE) MG tablet Take by mouth.    [provider]  methocarbamol (ROBAXIN) 500 MG tablet Take by mouth. 09/17/2020 09/16/20  [provider]  nabumetone (RELAFEN) 750 MG tablet Take by mouth. 08/27/2020 09/23/20  [provider]  traMADol Janean Sark) 50 MG tablet Take by mouth. 09/15/2020 09/14/20  [provider]    Allergies    Patient has no allergy information on record.  Review of Systems   Review of Systems Ten systems are reviewed and are negative for acute change except as noted in the HPI  Physical Exam Updated Vital Signs BP (!) 208/100 (BP Location: Left Arm)   Pulse 93   Temp 98.6 F (37 C) (Oral)   Resp 18   Ht 5' (1.524 m)   Wt 39.1 kg   LMP 07/30/2020 (Approximate)   SpO2 100%   BMI 16.82 kg/m   Physical Exam Constitutional:      General: She is not in acute distress.    Appearance: Normal appearance. She is well-developed. She is not ill-appearing or diaphoretic.  HENT:     Head: Normocephalic and atraumatic.  Eyes:     General: Vision grossly intact. Gaze aligned appropriately.     Pupils: Pupils are equal, round, and reactive to light.  Neck:     Trachea: Trachea and phonation normal.  Pulmonary:     Effort: Pulmonary effort is normal. No respiratory distress.  Abdominal:     General: There is no distension.  Palpations: Abdomen is soft.     Tenderness: There is no abdominal tenderness. There is no guarding or rebound.  Genitourinary:    Comments: Rectal examination chaperoned by Augusto GambleJerri RN. External examination within normal limits, no hemorrhoids fissures or bleeding.  Normal rectal tone.  No palpable internal hemorrhoids.  Light brown stool present without frank blood. Musculoskeletal:        General: Normal range of motion.     Cervical back: Normal range of motion.  Skin:    General: Skin is warm and dry.  Neurological:     Mental Status: She is alert.     GCS: GCS eye subscore is 4. GCS verbal subscore is 5.  GCS motor subscore is 6.     Comments: Speech is clear and goal oriented, follows commands Major Cranial nerves without deficit, no facial droop Moves extremities without ataxia, coordination intact  Psychiatric:        Behavior: Behavior normal.     ED Results / Procedures / Treatments   Labs (all labs ordered are listed, but only abnormal results are displayed) Labs Reviewed  BASIC METABOLIC PANEL - Abnormal; Notable for the following components:      Result Value   Sodium 131 (*)    Potassium 3.3 (*)    Chloride 94 (*)    CO2 17 (*)    Glucose, Bld 114 (*)    BUN 118 (*)    Creatinine, Ser 10.12 (*)    GFR, Estimated 4 (*)    Anion gap 20 (*)    All other components within normal limits  CBC - Abnormal; Notable for the following components:   RBC 1.87 (*)    Hemoglobin 5.4 (*)    HCT 16.1 (*)    All other components within normal limits  HEPATIC FUNCTION PANEL - Abnormal; Notable for the following components:   Albumin 3.3 (*)    All other components within normal limits  LIPASE, BLOOD - Abnormal; Notable for the following components:   Lipase 57 (*)    All other components within normal limits  TROPONIN I (HIGH SENSITIVITY) - Abnormal; Notable for the following components:   Troponin I (High Sensitivity) 338 (*)    All other components within normal limits  RESPIRATORY PANEL BY RT PCR (FLU A&B, COVID)  OCCULT BLOOD X 1 CARD TO LAB, STOOL  VITAMIN B12  FOLATE  IRON AND TIBC  FERRITIN  RETICULOCYTES  PREGNANCY, URINE  URINALYSIS, ROUTINE W REFLEX MICROSCOPIC  TROPONIN I (HIGH SENSITIVITY)    EKG EKG Interpretation  Date/Time:  Saturday September 09 2020 15:24:24 EDT Ventricular Rate:  88 PR Interval:    QRS Duration: 95 QT Interval:  402 QTC Calculation: 487 R Axis:   47 Text Interpretation: Sinus rhythm LVH with secondary repolarization abnormality Anterior ST elevation, probably due to LVH Borderline prolonged QT interval No old tracing to compare  Confirmed by Melene PlanFloyd, Dan [phone]([id number]) on 08/31/2020 3:52:36 PM   Radiology DG Chest Port 1 View  Result Date: 09/20/2020 CLINICAL DATA:  Chest pain EXAM: PORTABLE CHEST 1 VIEW COMPARISON:  09/05/2020 FINDINGS: Heart is borderline in size. Lungs clear. No focal shadows project over both lower lungs. No effusions. No acute bony abnormality. IMPRESSION: Borderline heart size.  No active disease. Electronically Signed   By: Charlett NoseKevin  Dover M.D.   On: 08/31/2020 16:27    Procedures .Critical Care Performed by: Bill SalinasMorelli, Roselene Gray A, PA-C Authorized by: Bill SalinasMorelli, Blonnie Maske A, PA-C   Critical care provider statement:  Critical care time (minutes):  50   Critical care was time spent personally by me on the following activities:  Discussions with consultants, evaluation of patient's response to treatment, examination of patient, ordering and performing treatments and interventions, ordering and review of laboratory studies, ordering and review of radiographic studies, pulse oximetry, re-evaluation of patient's condition, obtaining history from patient or surrogate, review of old charts and development of treatment plan with patient or surrogate   (including critical care time)  Medications Ordered in ED Medications  fentaNYL (SUBLIMAZE) injection 50 mcg (50 mcg Intravenous Given 09/12/2020 1606)  sodium chloride 0.9 % bolus 1,000 mL (1,000 mLs Intravenous New Bag/Given 09/17/2020 1719)  labetalol (NORMODYNE) injection 5 mg (5 mg Intravenous Given 09/02/2020 1800)    ED Course  I have reviewed the triage vital signs and the nursing notes.  Pertinent labs & imaging results that were available during my care of the patient were reviewed by me and considered in my medical decision making (see chart for details).  Clinical Course as of Sep 09 1805  Sat Sep 09, 2020  1655 Dr. Rennis Golden. Blood and Echo.   [BM]  1706 Dr. Edward Jolly   [BM]    Clinical Course User Index [BM] Elizabeth Palau   MDM  Rules/Calculators/A&P                         Additional history obtained from: 1. Nursing notes from this visit. 2. Electronic medical record review.  Patient seen at urgent care today, diagnosis of musculoskeletal back pain related to pushing heavy shopping carts, muscular strains, accelerated essential hypertension, holosystolic murmur and ocular proptosis.  Labs were significant for anemia 5.9, no leukocytosis.  Normal T3/T4.  Troponin of 294.  Creatinine 10.17, BUN 117, normal potassium, gap of 21. -------------------------------------- I ordered, reviewed and interpreted labs which include: Covid/influenza panel negative. Hemoccult negative. Lipase minimally elevated at 57. LFTs unremarkable. BMP shows creatinine 10.12, BUN 118, potassium 3.3, gap 28. High-sensitivity troponin 338. CBC shows hemoglobin of 5.4, no leukocytosis.  CXR:  IMPRESSION:  Borderline heart size. No active disease   EKG: Sinus rhythm LVH with secondary repolarization abnormality Anterior ST elevation, probably due to LVH Borderline prolonged QT interval No old tracing to compare Confirmed by Melene Plan [phone]) on 09/19/2020 3:52:36 PM - Given increase in the troponin level consult was placed to cardiology.  I spoke with Dr. Rennis Golden at 4:55 PM, recommended this is likely demand secondary to severe anemia, advises patient received blood products and echocardiogram. - At [facility] there are only limited blood products patient will need transfer prior to transfusion based on protocol.  Patient remained stable without tachycardia or hypoxia or active chest pain.  She is agreeable for transfer and transfusion.  I also ordered a CT renal stone study for examination of the abdomen given the kidney failure.  She does not have any tenderness of the abdomen, her back pain is reproducible with palpation of the paraspinal muscles. - 5:06 PM: Discussed the case with hospitalist Dr. Edward Jolly, patient was accepted for  admission. - Patient reevaluated resting comfortably no acute distress.  She states understanding of need for admission she has no questions at this time.  Urine pregnancy test is negative.  Urinalysis and CT renal stone study pending.  Patient was seen and evaluated by Dr. Adela Lank during this visit. -----  Addendum I reevaluated patient 6:50 PM, she is resting comfortably in bed using her  phone family member at bedside, patient denies pain and is requesting something to eat, I advised patient that she needs to wait until CT scan results prior to eating or drinking.  6:56 PM: Received call from radiologist, CT Noncon of the abdomen was concerning for an aortic dissection, mediastinal hematoma.  Discussed case with Dr. Adela Lank CT angio dissection study ordered.  Care handoff given to Dr. Adela Lank at shift change.  7:01 PM: Patient updated on possible findings, she is agreeable to additional studies.  She remained stable and pain-free.  I informed nursing staff of the situation, [facility] will be updated by RN and she will obtain the CT angio prior to any transfer.  Note: Portions of this report may have been transcribed using voice recognition software. Every effort was made to ensure accuracy; however, inadvertent computerized transcription errors may still be present. Final Clinical Impression(s) / ED Diagnoses Final diagnoses:  Symptomatic anemia  Elevated troponin  Renal failure, unspecified chronicity    Rx / DC Orders ED Discharge Orders    None       Bill Salinas, PA-C 09/05/2020 1903    Melene Plan, DO 08/30/2020 2052

## 2020-09-09 NOTE — ED Notes (Signed)
Pt endorses UC calling that hgb was 5.7, sent to ED

## 2020-09-09 NOTE — Anesthesia Procedure Notes (Signed)
Arterial Line Insertion Start/End10/31/2021 9:45 PM, 09/09/2020 9:50 PM Performed by: Adonis Housekeeper, CRNA, CRNA  Patient location: Pre-op. Preanesthetic checklist: patient identified, IV checked, surgical consent, monitors and equipment checked, pre-op evaluation and timeout performed Right, radial was placed Catheter size: 20 G Hand hygiene performed , maximum sterile barriers used  and Seldinger technique used Allen's test indicative of satisfactory collateral circulation Attempts: 3 Procedure performed without using ultrasound guided technique. Following insertion, dressing applied and Biopatch. Post procedure assessment: normal  Patient tolerated the procedure well with no immediate complications.

## 2020-09-09 NOTE — ED Notes (Signed)
Report given to [facility] at bedside 

## 2020-09-09 NOTE — ED Notes (Signed)
Vomited small of contents

## 2020-09-09 NOTE — ED Notes (Signed)
Imaging awaiting results of urine pregnancy per [facility] Radiology Protocol

## 2020-09-09 NOTE — ED Notes (Signed)
Date and time results received: 08/28/2020 1549 Test: hgb Critical Value: 5.4  Name of Provider Notified:Brandon PA Orders Received? Or Actions Taken?: no orders given

## 2020-09-09 NOTE — Anesthesia Preprocedure Evaluation (Addendum)
Anesthesia Evaluation  Patient identified by MRN, date of birth, ID band Patient awake    Reviewed: Allergy & Precautions, NPO status , Patient's Chart, lab work & pertinent test resultsPreop documentation limited or incomplete due to emergent nature of procedure.  History of Anesthesia Complications Negative for: history of anesthetic complications  Airway Mallampati: I  TM Distance: >3 FB Neck ROM: Full    Dental  (+) Dental Advisory Given   Pulmonary  08/25/2020 SARS coronavirus NEG   breath sounds clear to auscultation       Cardiovascular hypertension (poorly controlled), Pt. 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)   Rhythm:Regular Rate:Normal     Neuro/Psych negative neurological ROS     GI/Hepatic negative GI ROS, Neg liver ROS,   Endo/Other  negative endocrine ROS  Renal/GU ARFRenal disease (creat 10.12, K+ 3.3)     Musculoskeletal   Abdominal   Peds  Hematology  (+) Blood dyscrasia (Hb 5.4), anemia ,   Anesthesia Other Findings   Reproductive/Obstetrics                            Anesthesia Physical Anesthesia Plan  ASA: IV and emergent  Anesthesia Plan: General   Post-op Pain Management:    Induction: Intravenous and Rapid sequence  PONV Risk Score and Plan: 3 and Ondansetron, Dexamethasone, Scopolamine patch - Pre-op and Treatment may vary due to age or medical condition  Airway Management Planned: Oral ETT  Additional Equipment: Arterial line  Intra-op Plan:   Post-operative Plan: Possible Post-op intubation/ventilation  Informed Consent: I have reviewed the patients History and Physical, chart, labs and discussed the procedure including the risks, benefits and alternatives for the proposed anesthesia with the patient or authorized representative who has indicated his/her understanding and  acceptance.     Dental advisory given  Plan Discussed with: CRNA and Surgeon  Anesthesia Plan Comments:        Anesthesia Quick Evaluation

## 2020-09-09 NOTE — Anesthesia Procedure Notes (Signed)
Procedure Name: Intubation Date/Time: 09/06/2020 10:18 PM Performed by: Edmonia Caprio, CRNA Pre-anesthesia Checklist: Patient identified, Emergency Drugs available, Suction available, Timeout performed and Patient being monitored Patient Re-evaluated:Patient Re-evaluated prior to induction Oxygen Delivery Method: Circle system utilized Preoxygenation: Pre-oxygenation with 100% oxygen Induction Type: IV induction and Rapid sequence Laryngoscope Size: Miller and 2 Grade View: Grade I Tube type: Oral Tube size: 7.0 mm Number of attempts: 1 Airway Equipment and Method: Stylet Placement Confirmation: ETT inserted through vocal cords under direct vision,  breath sounds checked- equal and bilateral and positive ETCO2 Secured at: 22 cm Tube secured with: Tape Dental Injury: Teeth and Oropharynx as per pre-operative assessment

## 2020-09-09 NOTE — ED Notes (Signed)
Report given to receiving nurse Renee RN at [facility]  For room 55M-14

## 2020-09-09 NOTE — ED Triage Notes (Signed)
Pt arrived to ED via [facility] from [facility] w/ dissecting descending aorta. Pt A&Ox4 w/ c/o 3/10 back pain. Pt arrived w/ cleviprex. NS and esmolol infusing.

## 2020-09-09 NOTE — ED Notes (Signed)
Date and time results received: 09/11/2020 1614   Test:trp Critical Value: 338 Name of Provider Notified: Apolinar Junes PA  Orders Received? Or Actions Taken?:no orders given

## 2020-09-09 NOTE — ED Notes (Signed)
Report called to charge nurse at [facility] ED, [place] RN

## 2020-09-09 NOTE — Consult Note (Signed)
Hospital Consult    Reason for Consult: Type B descending thoracic dissection with concern for contained rupture Referring Physician: [facility] MRN #:  [id number]  History of Present Illness: This is a 34 y.o. female with history of hypertension that presents as a transfer from [facility] with concern for a type B thoracic dissection and concern for contained rupture with mediastinal hematoma.  Patient presented to [facility] with approximate 2 weeks of back pain and worsening fatigue.  Ultimately she states this started 2 weeks ago while pushing a shopping cart.  States she has blood pressure issues at baseline and has not been taking her medications for the last year.  CT a at [facility] confirmed type B descending thoracic dissection with concern for rupture and mediastinal hematoma.  On presentation she is profoundly anemic with a hemoglobin of 5.  She is also in acute renal failure with creatinine of 10.  She denies any history of connective tissue disorders including Marfan's.  She denies any abdominal pain.  She endorses only back pain.  She is motor and sensory intact her lower extremities.    Past Medical History:  Diagnosis Date   Anemia    Hypertension     History reviewed. No pertinent surgical history.  Not on File  Prior to Admission medications   Medication Sig Start Date End Date Taking? Authorizing Provider  amLODipine (NORVASC) 5 MG tablet Take by mouth. 09/12/2020 10/09/20  [provider]  ferrous sulfate 325 (65 FE) MG tablet Take by mouth.    [provider]  methocarbamol (ROBAXIN) 500 MG tablet Take by mouth. 08/31/2020 09/16/20  [provider]  nabumetone (RELAFEN) 750 MG tablet Take by mouth. 08/26/2020 09/23/20  [provider]  traMADol Janean Sark) 50 MG tablet Take by mouth. 09/08/2020 09/14/20  [provider]    Social History   Socioeconomic History   Marital  status: Single    Spouse name: Not on file   Number of children: Not on file   Years of education: Not on file   Highest education level: Not on file  Occupational History   Not on file  Tobacco Use   Smoking status: Never Smoker  Substance and Sexual Activity   Alcohol use: Not Currently   Drug use: Not Currently   Sexual activity: Not on file  Other Topics Concern   Not on file  Social History Narrative   Not on file   Social Determinants of Health   Financial Resource Strain:    Difficulty of Paying Living Expenses: Not on file  Food Insecurity:    Worried About Running Out of Food in the Last Year: Not on file   Ran Out of Food in the Last Year: Not on file  Transportation Needs:    Lack of Transportation (Medical): Not on file   Lack of Transportation (Non-Medical): Not on file  Physical Activity:    Days of Exercise per Week: Not on file   Minutes of Exercise per Session: Not on file  Stress:    Feeling of Stress : Not on file  Social Connections:    Frequency of Communication with Friends and Family: Not on file   Frequency of Social Gatherings with Friends and Family: Not on file   Attends Religious Services: Not on file   Active Member of Clubs or Organizations: Not on file   Attends Banker Meetings: Not on file  Marital Status: Not on file  Intimate Partner Violence:    Fear of Current or Ex-Partner: Not on file   Emotionally Abused: Not on file   Physically Abused: Not on file   Sexually Abused: Not on file     History reviewed. No pertinent family history.  ROS: [x]  Positive   [ ]  Negative   [ ]  All sytems reviewed and are negative  Cardiovascular: []  chest pain/pressure []  palpitations []  SOB lying flat []  DOE []  pain in legs while walking []  pain in legs at rest []  pain in legs at night []  non-healing ulcers []  hx of DVT []  swelling in legs  Pulmonary: []  productive cough []  asthma/wheezing []   home O2  Neurologic: []  weakness in []  arms []  legs []  numbness in []  arms []  legs []  hx of CVA []  mini stroke [] difficulty speaking or slurred speech []  temporary loss of vision in one eye []  dizziness  Hematologic: []  hx of cancer []  bleeding problems []  problems with blood clotting easily  Endocrine:   []  diabetes []  thyroid disease  GI []  vomiting blood []  blood in stool  GU: []  CKD/renal failure []  HD--[]  M/W/F or []  T/T/S []  burning with urination []  blood in urine  Psychiatric: []  anxiety []  depression  Musculoskeletal: []  arthritis []  joint pain X back pain Integumentary: []  rashes []  ulcers  Constitutional: []  fever []  chills   Physical Examination  Vitals:   09/01/2020 2130 08/25/2020 2135  BP: 135/75 (!) 118/56  Pulse: 67 68  Resp: 11 12  Temp:    SpO2: 93% 97%   Body mass index is 16.82 kg/m.  General:  Mild distress HENT: WNL, normocephalic Pulmonary: normal non-labored breathing, without Rales, rhonchi,  wheezing Cardiac: regular, without  Murmurs, rubs or gallops Abdomen:  soft, NT/ND, no masses Vascular Exam/Pulses: Palpable femoral pulses both groins Palpable dorsalis pedis pulses bilateral lower extremities  Extremities: without ischemic changes, without Gangrene , without cellulitis; without open wounds;  Musculoskeletal: no muscle wasting or atrophy  Neurologic: A&O X 3; Appropriate Affect ; SENSATION: normal; MOTOR FUNCTION:  moving all extremities equally. Speech is fluent/normal   CBC    Component Value Date/Time   WBC 9.0 09/21/2020 1506   RBC 1.87 (L) 09/24/2020 1506   HGB 5.4 (LL) 09/08/2020 1506   HCT 16.1 (L) 09/03/2020 1506   PLT 237 08/30/2020 1506   MCV 86.1 09/19/2020 1506   MCH 28.9 09/16/2020 1506   MCHC 33.5 09/23/2020 1506   RDW 15.4 09/02/2020 1506    BMET    Component Value Date/Time   NA 131 (L) 09/03/2020 1506   K 3.3 (L) 09/23/2020 1506   CL 94 (L) 09/16/2020 1506   CO2 17 (L) 09/23/2020 1506     GLUCOSE 114 (H) 09/12/2020 1506   BUN 118 (H) 09/07/2020 1506   CREATININE 10.12 (H) 09/08/2020 1506   CALCIUM 9.1 09/19/2020 1506   GFRNONAA 4 (L) 09/22/2020 1506    COAGS: No results found for: INR, PROTIME   Non-Invasive Vascular Imaging:    I have reviewed CTA chest abdomen pelvis and appears to be a type B dissection distal to the left subclavian artery that extends down to just above the aortic bifurcation.  There is a lot of mediastinal hematoma.  I do not see any active blush to identify any active hemorrhage.  There is a fenestration near the diaphragmatic hiatus.  The celiac SMA and renal arteries do appear to come off the true lumen although  the true lumen appears much smaller.   ASSESSMENT/PLAN: This is a 34 y.o. 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  She does have a lot of mediastinal hematoma and concern for contained hemorrhage.  I have reviewed her imaging with radiology and it is difficult to identify any active blush or focal area of hemorrhage but she does have a lot of mediastinal hematoma as already noted.  She is profoundly anemic with a hemoglobin of 5.  I recommended emergently proceeding to the operating room for stent graft repair of her descending thoracic aorta for repair of dissection.  I discussed risk and benefits with her including risk of vessel injury, stent malposition, ongoing renal failure issues post-op ultimately requiring dialysis, paraplegia, risk of anesthesia.  I have asked critical care to admit her and will assist with blood pressure control and appreciate their help.  Aggressive titration of BP with goal systolic <120.    Cephus Shelling, MD [facility] [place] Office: [phone]  Cephus Shelling

## 2020-09-09 NOTE — ED Provider Notes (Signed)
Patient transferred from [facility] to here at [facility] for vascular surgery and ICU.  Patient is only having minimal pain right now and her pressure has been downtrending to about 138 systolic right now.  Continues to decrease with Cleviprex and esmolol.  I discussed with Dr. Chestine Spore of vascular surgery who has seen the patient and asked for emergency release blood and he is taking her to the OR.  She will be typed and crossed as well.  ICU was also consulted and is currently seeing the patient in the emergency department.  She is currently in critical condition going directly to the operating room.  CRITICAL CARE Performed by: Audree Camel   Total critical care time: 30 minutes  Critical care time was exclusive of separately billable procedures and treating other patients.  Critical care was necessary to treat or prevent imminent or life-threatening deterioration.  Critical care was time spent personally by me on the following activities: development of treatment plan with patient and/or surrogate as well as nursing, discussions with consultants, evaluation of patient's response to treatment, examination of patient, obtaining history from patient or surrogate, ordering and performing treatments and interventions, ordering and review of laboratory studies, ordering and review of radiographic studies, pulse oximetry and re-evaluation of patient's condition.    Pricilla Loveless, MD 09/14/2020 2204

## 2020-09-09 NOTE — ED Notes (Signed)
Unable to provide u/a at this time 

## 2020-09-10 ENCOUNTER — Inpatient Hospital Stay (HOSPITAL_COMMUNITY): Payer: Medicaid Other

## 2020-09-10 DIAGNOSIS — I7103 Dissection of thoracoabdominal aorta: Secondary | ICD-10-CM

## 2020-09-10 DIAGNOSIS — J9601 Acute respiratory failure with hypoxia: Secondary | ICD-10-CM | POA: Diagnosis not present

## 2020-09-10 DIAGNOSIS — K661 Hemoperitoneum: Secondary | ICD-10-CM | POA: Diagnosis not present

## 2020-09-10 DIAGNOSIS — D649 Anemia, unspecified: Secondary | ICD-10-CM

## 2020-09-10 DIAGNOSIS — I161 Hypertensive emergency: Secondary | ICD-10-CM

## 2020-09-10 DIAGNOSIS — I7101 Dissection of thoracic aorta: Secondary | ICD-10-CM | POA: Diagnosis not present

## 2020-09-10 DIAGNOSIS — N170 Acute kidney failure with tubular necrosis: Secondary | ICD-10-CM | POA: Diagnosis not present

## 2020-09-10 DIAGNOSIS — I712 Thoracic aortic aneurysm, without rupture: Secondary | ICD-10-CM

## 2020-09-10 LAB — POCT I-STAT 7, (LYTES, BLD GAS, ICA,H+H)
Acid-base deficit: 8 mmol/L — ABNORMAL HIGH (ref 0.0–2.0)
Acid-base deficit: 8 mmol/L — ABNORMAL HIGH (ref 0.0–2.0)
Acid-base deficit: 9 mmol/L — ABNORMAL HIGH (ref 0.0–2.0)
Bicarbonate: 14 mmol/L — ABNORMAL LOW (ref 20.0–28.0)
Bicarbonate: 15.4 mmol/L — ABNORMAL LOW (ref 20.0–28.0)
Bicarbonate: 15.5 mmol/L — ABNORMAL LOW (ref 20.0–28.0)
Calcium, Ion: 0.87 mmol/L — CL (ref 1.15–1.40)
Calcium, Ion: 1 mmol/L — ABNORMAL LOW (ref 1.15–1.40)
Calcium, Ion: 1 mmol/L — ABNORMAL LOW (ref 1.15–1.40)
HCT: 29 % — ABNORMAL LOW (ref 36.0–46.0)
HCT: 30 % — ABNORMAL LOW (ref 36.0–46.0)
HCT: 30 % — ABNORMAL LOW (ref 36.0–46.0)
Hemoglobin: 10.2 g/dL — ABNORMAL LOW (ref 12.0–15.0)
Hemoglobin: 10.2 g/dL — ABNORMAL LOW (ref 12.0–15.0)
Hemoglobin: 9.9 g/dL — ABNORMAL LOW (ref 12.0–15.0)
O2 Saturation: 100 %
O2 Saturation: 100 %
O2 Saturation: 100 %
Potassium: 3.5 mmol/L (ref 3.5–5.1)
Potassium: 3.6 mmol/L (ref 3.5–5.1)
Potassium: 4.3 mmol/L (ref 3.5–5.1)
Sodium: 135 mmol/L (ref 135–145)
Sodium: 135 mmol/L (ref 135–145)
Sodium: 135 mmol/L (ref 135–145)
TCO2: 15 mmol/L — ABNORMAL LOW (ref 22–32)
TCO2: 16 mmol/L — ABNORMAL LOW (ref 22–32)
TCO2: 16 mmol/L — ABNORMAL LOW (ref 22–32)
pCO2 arterial: 22.7 mmHg — ABNORMAL LOW (ref 32.0–48.0)
pCO2 arterial: 24.9 mmHg — ABNORMAL LOW (ref 32.0–48.0)
pCO2 arterial: 25.6 mmHg — ABNORMAL LOW (ref 32.0–48.0)
pH, Arterial: 7.391 (ref 7.350–7.450)
pH, Arterial: 7.397 (ref 7.350–7.450)
pH, Arterial: 7.399 (ref 7.350–7.450)
pO2, Arterial: 255 mmHg — ABNORMAL HIGH (ref 83.0–108.0)
pO2, Arterial: 261 mmHg — ABNORMAL HIGH (ref 83.0–108.0)
pO2, Arterial: 266 mmHg — ABNORMAL HIGH (ref 83.0–108.0)

## 2020-09-10 LAB — PREPARE FRESH FROZEN PLASMA
Unit division: 0
Unit division: 0
Unit division: 0
Unit division: 0
Unit division: 0

## 2020-09-10 LAB — BPAM FFP
Blood Product Expiration Date: 202110162359
Blood Product Expiration Date: 202110192359
Blood Product Expiration Date: 202110202359
Blood Product Expiration Date: 202110202359
Blood Product Expiration Date: 202110212359
Blood Product Expiration Date: 202110212359
Blood Product Expiration Date: 202110212359
Blood Product Expiration Date: 202110212359
Blood Product Expiration Date: 202110212359
Blood Product Expiration Date: 202110212359
Blood Product Expiration Date: 202110212359
Blood Product Expiration Date: 202110212359
ISSUE DATE / TIME: 202110162211
ISSUE DATE / TIME: 202110162211
ISSUE DATE / TIME: 202110162211
ISSUE DATE / TIME: 202110162211
ISSUE DATE / TIME: 202110162238
ISSUE DATE / TIME: 202110162238
ISSUE DATE / TIME: 202110162238
ISSUE DATE / TIME: 202110162238
Unit Type and Rh: 1700
Unit Type and Rh: 1700
Unit Type and Rh: 6200
Unit Type and Rh: 6200
Unit Type and Rh: 6200
Unit Type and Rh: 7300
Unit Type and Rh: 7300
Unit Type and Rh: 7300
Unit Type and Rh: 7300
Unit Type and Rh: 7300
Unit Type and Rh: 7300
Unit Type and Rh: 7300

## 2020-09-10 LAB — BASIC METABOLIC PANEL
Anion gap: 18 — ABNORMAL HIGH (ref 5–15)
Anion gap: 19 — ABNORMAL HIGH (ref 5–15)
BUN: 101 mg/dL — ABNORMAL HIGH (ref 6–20)
BUN: 102 mg/dL — ABNORMAL HIGH (ref 6–20)
CO2: 13 mmol/L — ABNORMAL LOW (ref 22–32)
CO2: 14 mmol/L — ABNORMAL LOW (ref 22–32)
Calcium: 7.3 mg/dL — ABNORMAL LOW (ref 8.9–10.3)
Calcium: 8 mg/dL — ABNORMAL LOW (ref 8.9–10.3)
Chloride: 104 mmol/L (ref 98–111)
Chloride: 103 mmol/L (ref 98–111)
Creatinine, Ser: 8.29 mg/dL — ABNORMAL HIGH (ref 0.44–1.00)
Creatinine, Ser: 8.13 mg/dL — ABNORMAL HIGH (ref 0.44–1.00)
GFR, Estimated: 6 mL/min — ABNORMAL LOW (ref >60)
GFR, Estimated: 6 mL/min — ABNORMAL LOW (ref >60)
Glucose, Bld: 98 mg/dL (ref 70–99)
Glucose, Bld: 143 mg/dL — ABNORMAL HIGH (ref 70–99)
Potassium: 3.5 mmol/L (ref 3.5–5.1)
Potassium: 3.8 mmol/L (ref 3.5–5.1)
Sodium: 135 mmol/L (ref 135–145)
Sodium: 136 mmol/L (ref 135–145)

## 2020-09-10 LAB — PROTIME-INR
INR: 1.3 — ABNORMAL HIGH (ref 0.8–1.2)
Prothrombin Time: 15.8 seconds — ABNORMAL HIGH (ref 11.4–15.2)

## 2020-09-10 LAB — CBC
HCT: 34.8 % — ABNORMAL LOW (ref 36.0–46.0)
HCT: 34.5 % — ABNORMAL LOW (ref 36.0–46.0)
Hemoglobin: 11.5 g/dL — ABNORMAL LOW (ref 12.0–15.0)
Hemoglobin: 12.7 g/dL (ref 12.0–15.0)
MCH: 28 pg (ref 26.0–34.0)
MCH: 30.8 pg (ref 26.0–34.0)
MCHC: 33 g/dL (ref 30.0–36.0)
MCHC: 36.8 g/dL — ABNORMAL HIGH (ref 30.0–36.0)
MCV: 84.7 fL (ref 80.0–100.0)
MCV: 83.5 fL (ref 80.0–100.0)
Platelets: 161 10*3/uL (ref 150–400)
Platelets: 176 10*3/uL (ref 150–400)
RBC: 4.11 MIL/uL (ref 3.87–5.11)
RBC: 4.13 MIL/uL (ref 3.87–5.11)
RDW: 15.2 % (ref 11.5–15.5)
RDW: 15.8 % — ABNORMAL HIGH (ref 11.5–15.5)
WBC: 13.9 10*3/uL — ABNORMAL HIGH (ref 4.0–10.5)
WBC: 11.6 10*3/uL — ABNORMAL HIGH (ref 4.0–10.5)
nRBC: 0.2 % (ref 0.0–0.2)
nRBC: 0.3 % — ABNORMAL HIGH (ref 0.0–0.2)

## 2020-09-10 LAB — MRSA PCR SCREENING: MRSA by PCR: NEGATIVE

## 2020-09-10 LAB — ECHO INTRAOPERATIVE TEE
Height: 60 in
Weight: 1377.6 oz

## 2020-09-10 LAB — MAGNESIUM
Magnesium: 2 mg/dL (ref 1.7–2.4)
Magnesium: 2.8 mg/dL — ABNORMAL HIGH (ref 1.7–2.4)

## 2020-09-10 LAB — IRON AND TIBC
Iron: 16 ug/dL — ABNORMAL LOW (ref 28–170)
Saturation Ratios: 5 % — ABNORMAL LOW (ref 10.4–31.8)
TIBC: 329 ug/dL (ref 250–450)
UIBC: 313 ug/dL

## 2020-09-10 LAB — VITAMIN B12: Vitamin B-12: 227 pg/mL (ref 180–914)

## 2020-09-10 LAB — FOLATE: Folate: 9.8 ng/mL (ref >5.9)

## 2020-09-10 LAB — FERRITIN: Ferritin: 13 ng/mL (ref 11–307)

## 2020-09-10 LAB — APTT: aPTT: 41 seconds — ABNORMAL HIGH (ref 24–36)

## 2020-09-10 LAB — HIV ANTIBODY (ROUTINE TESTING W REFLEX): HIV Screen 4th Generation wRfx: NONREACTIVE

## 2020-09-10 MED ORDER — HYDRALAZINE HCL 20 MG/ML IJ SOLN
10.0000 mg | INTRAMUSCULAR | Status: DC | PRN
  Administered 2020-09-10 (×4): 20 mg via INTRAVENOUS
  Filled 2020-09-10 (×4): qty 1

## 2020-09-10 MED ORDER — HEPARIN SODIUM (PORCINE) 5000 UNIT/ML IJ SOLN: 5000.0000 [IU] | Freq: Three times a day (TID) | INTRAMUSCULAR | Status: DC

## 2020-09-10 MED ORDER — ACETAMINOPHEN 325 MG PO TABS: 325.0000 mg | ORAL_TABLET | ORAL | Status: DC | PRN

## 2020-09-10 MED ORDER — ONDANSETRON HCL 4 MG/2ML IJ SOLN
INTRAMUSCULAR | Status: AC
  Filled 2020-09-10: qty 2

## 2020-09-10 MED ORDER — PROTAMINE SULFATE 10 MG/ML IV SOLN
INTRAVENOUS | Status: DC | PRN
  Administered 2020-09-10 (×3): 10 mg via INTRAVENOUS

## 2020-09-10 MED ORDER — ROCURONIUM BROMIDE 10 MG/ML (PF) SYRINGE
PREFILLED_SYRINGE | INTRAVENOUS | Status: AC
  Filled 2020-09-10: qty 10

## 2020-09-10 MED ORDER — ALBUMIN HUMAN 5 % IV SOLN: INTRAVENOUS | Status: DC | PRN

## 2020-09-10 MED ORDER — HEPARIN SODIUM (PORCINE) 1000 UNIT/ML IJ SOLN
INTRAMUSCULAR | Status: AC
  Filled 2020-09-10: qty 1

## 2020-09-10 MED ORDER — CARVEDILOL 6.25 MG PO TABS
6.2500 mg | ORAL_TABLET | Freq: Two times a day (BID) | ORAL | Status: DC
  Administered 2020-09-10 – 2020-09-11 (×2): 6.25 mg via ORAL
  Filled 2020-09-10 (×2): qty 1

## 2020-09-10 MED ORDER — MIDAZOLAM HCL 2 MG/2ML IJ SOLN: 0.5000 mg | Freq: Once | INTRAMUSCULAR | Status: DC | PRN

## 2020-09-10 MED ORDER — LACTATED RINGERS IV SOLN: INTRAVENOUS | Status: DC

## 2020-09-10 MED ORDER — SUCCINYLCHOLINE CHLORIDE 200 MG/10ML IV SOSY
PREFILLED_SYRINGE | INTRAVENOUS | Status: AC
  Filled 2020-09-10: qty 10

## 2020-09-10 MED ORDER — DEXAMETHASONE SODIUM PHOSPHATE 10 MG/ML IJ SOLN
INTRAMUSCULAR | Status: AC
  Filled 2020-09-10: qty 1

## 2020-09-10 MED ORDER — MEPERIDINE HCL 25 MG/ML IJ SOLN: 6.2500 mg | INTRAMUSCULAR | Status: DC | PRN

## 2020-09-10 MED ORDER — CALCIUM GLUCONATE-NACL 1-0.675 GM/50ML-% IV SOLN
1.0000 g | Freq: Once | INTRAVENOUS | Status: AC
  Administered 2020-09-10: 1000 mg via INTRAVENOUS
  Filled 2020-09-10: qty 50

## 2020-09-10 MED ORDER — LIDOCAINE 2% (20 MG/ML) 5 ML SYRINGE
INTRAMUSCULAR | Status: AC
  Filled 2020-09-10: qty 5

## 2020-09-10 MED ORDER — HYDRALAZINE HCL 25 MG PO TABS
25.0000 mg | ORAL_TABLET | Freq: Four times a day (QID) | ORAL | Status: DC
  Administered 2020-09-10 – 2020-09-11 (×4): 25 mg via ORAL
  Filled 2020-09-10 (×3): qty 1

## 2020-09-10 MED ORDER — PANTOPRAZOLE SODIUM 40 MG PO TBEC
40.0000 mg | DELAYED_RELEASE_TABLET | Freq: Every day | ORAL | Status: DC
  Administered 2020-09-10 – 2020-09-11 (×2): 40 mg via ORAL
  Filled 2020-09-10 (×2): qty 1

## 2020-09-10 MED ORDER — MAGNESIUM SULFATE 2 GM/50ML IV SOLN: 2.0000 g | Freq: Every day | INTRAVENOUS | Status: DC | PRN

## 2020-09-10 MED ORDER — HYDROMORPHONE HCL 1 MG/ML IJ SOLN
0.2500 mg | INTRAMUSCULAR | Status: DC | PRN
  Administered 2020-09-10: 0.25 mg via INTRAVENOUS
  Filled 2020-09-10 (×2): qty 0.5

## 2020-09-10 MED ORDER — ONDANSETRON HCL 4 MG/2ML IJ SOLN
INTRAMUSCULAR | Status: DC | PRN
  Administered 2020-09-09: 4 mg via INTRAVENOUS

## 2020-09-10 MED ORDER — SODIUM CHLORIDE 0.9 % IV SOLN: 500.0000 mL | Freq: Once | INTRAVENOUS | Status: DC | PRN

## 2020-09-10 MED ORDER — SODIUM BICARBONATE 8.4 % IV SOLN
50.0000 meq | Freq: Once | INTRAVENOUS | Status: AC
  Administered 2020-09-10: 50 meq via INTRAVENOUS
  Filled 2020-09-10: qty 50

## 2020-09-10 MED ORDER — ACETAMINOPHEN 325 MG RE SUPP: 325.0000 mg | RECTAL | Status: DC | PRN

## 2020-09-10 MED ORDER — ASPIRIN EC 81 MG PO TBEC
81.0000 mg | DELAYED_RELEASE_TABLET | Freq: Every day | ORAL | Status: DC
  Administered 2020-09-11: 81 mg via ORAL
  Filled 2020-09-10: qty 1

## 2020-09-10 MED ORDER — SUCCINYLCHOLINE CHLORIDE 20 MG/ML IJ SOLN
INTRAMUSCULAR | Status: DC | PRN
  Administered 2020-09-09: 90 mg via INTRAVENOUS

## 2020-09-10 MED ORDER — CHLORHEXIDINE GLUCONATE CLOTH 2 % EX PADS
6.0000 | MEDICATED_PAD | Freq: Every day | CUTANEOUS | Status: DC
  Administered 2020-09-10 – 2020-09-12 (×3): 6 via TOPICAL

## 2020-09-10 MED ORDER — SUGAMMADEX SODIUM 200 MG/2ML IV SOLN
INTRAVENOUS | Status: DC | PRN
  Administered 2020-09-10 (×2): 160 mg via INTRAVENOUS

## 2020-09-10 MED ORDER — AMLODIPINE BESYLATE 5 MG PO TABS
5.0000 mg | ORAL_TABLET | Freq: Every day | ORAL | Status: DC
  Administered 2020-09-10 – 2020-09-11 (×2): 5 mg via ORAL
  Filled 2020-09-10: qty 1

## 2020-09-10 MED ORDER — OXYCODONE-ACETAMINOPHEN 5-325 MG PO TABS
1.0000 | ORAL_TABLET | ORAL | Status: DC | PRN
  Administered 2020-09-10: 2 via ORAL
  Administered 2020-09-10: 1 via ORAL
  Filled 2020-09-10 (×2): qty 2
  Filled 2020-09-10: qty 1
  Filled 2020-09-10: qty 2

## 2020-09-10 MED ORDER — ESMOLOL HCL 100 MG/10ML IV SOLN
INTRAVENOUS | Status: AC
  Filled 2020-09-10: qty 10

## 2020-09-10 MED ORDER — ARTIFICIAL TEARS OPHTHALMIC OINT
TOPICAL_OINTMENT | OPHTHALMIC | Status: AC
  Filled 2020-09-10: qty 3.5

## 2020-09-10 MED ORDER — PHENOL 1.4 % MT LIQD: 1.0000 | OROMUCOSAL | Status: DC | PRN

## 2020-09-10 MED ORDER — HYDROMORPHONE HCL 1 MG/ML IJ SOLN
0.5000 mg | INTRAMUSCULAR | Status: DC | PRN
  Administered 2020-09-10 – 2020-09-11 (×5): 0.5 mg via INTRAVENOUS
  Filled 2020-09-10 (×4): qty 0.5

## 2020-09-10 MED ORDER — PROMETHAZINE HCL 25 MG/ML IJ SOLN
INTRAMUSCULAR | Status: AC
  Filled 2020-09-10: qty 1

## 2020-09-10 MED ORDER — PROTAMINE SULFATE 10 MG/ML IV SOLN
INTRAVENOUS | Status: AC
  Filled 2020-09-10: qty 5

## 2020-09-10 MED ORDER — POTASSIUM CHLORIDE CRYS ER 20 MEQ PO TBCR: 20.0000 meq | EXTENDED_RELEASE_TABLET | Freq: Every day | ORAL | Status: DC | PRN

## 2020-09-10 MED ORDER — CEFAZOLIN SODIUM-DEXTROSE 2-4 GM/100ML-% IV SOLN
2.0000 g | Freq: Three times a day (TID) | INTRAVENOUS | Status: AC
  Administered 2020-09-10 (×2): 2 g via INTRAVENOUS
  Filled 2020-09-10 (×2): qty 100

## 2020-09-10 NOTE — Transfer of Care (Signed)
Immediate Anesthesia Transfer of Care Note  Patient: Glenda Fuller  Procedure(s) Performed: Ultrasound Guided Right Common Femoral Artery Access.  Intravascular Ultrasound of Right Iliac Artery, Abdominal Aorta, Thoracic Aorta, Ascending and Descending  Aorta.  Right Iliac Arteriogram with Right Femoral Artery Perclose.  Insertion Thoracic Endovascular Stent Graft (N/A )  Patient Location: PACU  Anesthesia Type:General  Level of Consciousness: awake and alert   Airway & Oxygen Therapy: Patient Spontanous Breathing and Patient connected to nasal cannula oxygen  Post-op Assessment: Report given to RN, Post -op Vital signs reviewed and stable and Patient moving all extremities X 4  Post vital signs: Reviewed and stable  Last Vitals:  Vitals Value Taken Time  BP 162/93 09/10/20 0201  Temp    Pulse 59 09/10/20 0206  Resp 19 09/10/20 0206  SpO2 97 % 09/10/20 0206  Vitals shown include unvalidated device data.  Last Pain:  Vitals:   09/10/20 0159  TempSrc:   PainSc: 0-No pain         Complications: No complications documented.

## 2020-09-10 NOTE — Progress Notes (Signed)
[facility]  Subjective  -seems a bit confused.  Remains on Cleviprex and esmolol infusion.   Objective 105/75 (!) 46 97.7 F (36.5 C) 17 94%  Intake/Output Summary (Last 24 hours) at 09/10/2020 0908 Last data filed at 09/10/2020 0801 Gross per 24 hour  Intake 4458.79 ml  Output 155 ml  Net 4303.79 ml    Right groin clean dry intact, no hematoma Bilateral DP pulses palpable Moving her lower extremities without any apparent deficits  Laboratory Lab Results: Recent Labs    09/10/20 0210 09/10/20 0515  WBC 13.9* 11.6*  HGB 11.5* 12.7  HCT 34.8* 34.5*  PLT 161 176   BMET Recent Labs    09/10/20 0210 09/10/20 0515  NA 135 136  K 3.5 3.8  CL 104 103  CO2 13* 14*  GLUCOSE 98 143*  BUN 101* 102*  CREATININE 8.29* 8.13*  CALCIUM 7.3* 8.0*    COAG Lab Results  Component Value Date   INR 1.3 (H) 09/10/2020   No results found for: PTT  Assessment/Planning: POD #1 status post right transfemoral access for endovascular stent graft of descending thoracic aortic dissection without coverage of the left subclavian.  This was in the setting of profound anemia and mediastinal hematoma and concern for contained hemorrhage as well as likely malperfusion of her kidneys with acute renal failure.  Appreciate ICU assistance.  Seems to be doing okay without any apparent complications from stent graft.  Continue aggressive blood pressure control.  Will be high risk for dialysis but her creatinine is improved slightly although has had minimal urine output.  81 mg aspirin from our standpoint.  Cephus Shelling 09/10/2020 9:08 AM --

## 2020-09-10 NOTE — Progress Notes (Signed)
NAME:  Glenda Fuller, MRN:  [id number], DOB:  03-Feb-1986, LOS: 1 ADMISSION DATE:  09/16/2020, CONSULTATION DATE:  08/28/2020 REFERRING MD:  Adela Lank- [facility] EM, CHIEF COMPLAINT:  Back pain  // type B aortic dissection   Brief History   34 to F at [facility] ED found to have type B aortic dissection with mediastinal hematoma   History of present illness   34 yo F PMH HTN presents to [facility] ED from Urgent Care referral due to anemia Hgb 5.9. Pt initially presented to UC for worsening back pain, fatigue and SOB. Back pain began 2 weeks ago while pushing shopping cart, throbbing in nature and initially worsened with movement and improved with rest. 10/16 pain has moved, extending up the mid back. Labs revealed significant anemia as well as significant AKI. A CT renal stone study was obtained which was concerning for possible aortic dissection.CTA chest and pelvis revealed type B aortic dissection with rupture and mediastinal hematoma.  Pt BP 228/116 on presentation to [facility]. Started on esmolol and clevidipine infusions and is to be transferred to [facility] ED for admission to ICU.   Significant [facility] labs:  Hgb 5.4 HCT 16.1, hsTrop 338 Na 131 K 3.3 BUN 118 Cr 10.12 AG 20  Past Medical History  HTN  Significant Hospital Events   10/16 Referred to [facility] ED from UC for anemia. Found to have type B aortic dissection. On Esmolol and clevidipine for SBP > 200   Consults:  Vascular surgery   Procedures:  10/17 Procedure: 1.  Ultrasound-guided access of right common femoral artery with percutaneous closure for delivery of endografty 2.  Intravascular ultrasound (IVUS) of right iliac artery, abdominal aorta, descending thoracic aorta, aortic arch, and ascending aorta 3.  Thoracic aortogram 4.  Endovascular stent graft of descending thoracic aortic dissection without coverage of the left subclavian artery (31 mm x 31 mm x 20 cm Gore thoracic endograft) 5.  Right iliac arteriogram  Significant Diagnostic Tests:  CT  renal stone study> suspected aortic dissection originating in chest witn mediastinal hematoma. Hypertrophic LV. 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 Small bilateral pleural effusion, mild pulmonary edema  CTA c/a/p> Moderate hematoma extending from isthmus of aorta nearly to diaphragm. LV hypertrophy. Aortic Dissection beginning at isthmus of aorta and continuing into abdomen. False lumen compresses true lumen. Dissection terminates at livel of aortic bifurcation. True lumen serves celiac axis, SMA, bilateral renal arteries   Micro Data:  SARS Cov2> neg  Flu A / B> neg  Antimicrobials:    Interim history/subjective:  No events. Sleepy and hungry this AM.  Objective   Blood pressure 105/75, pulse (!) 46, temperature 97.7 F (36.5 C), resp. rate 17, height 5' (1.524 m), weight 44.9 kg, last menstrual period 07/30/2020, SpO2 94 %.        Intake/Output Summary (Last 24 hours) at 09/10/2020 1100 Last data filed at 09/10/2020 0801 Gross per 24 hour  Intake 4458.79 ml  Output 155 ml  Net 4303.79 ml   Filed Weights   08/30/2020 1508 09/10/20 0322  Weight: 39.1 kg 44.9 kg    Examination: Young woman in no acute distress Mucous membranes are dry Heart sounds are regular, bradycardic, extremities are warm Moves all 4 extremities to command Femoral access sites with no evidence of hematoma  Creatinine remains markedly elevated, potassium fine, hemoglobin stable at present  Resolved Hospital Problem list     Assessment & Plan:   Type B aortic dissection with significant kidney injury and  mediastinal hematoma- s/p repair by Dr. Chestine Spore on 09/10/20 Hypertensive Emergency, Hx poorly controlled HTN- requiring titrate of esmolol and cleviprex gtt, SBP goal < 120 , HR goal <60 ABLA- from mediastinal hematoma improved after transfusion  - Start diet - Start amlodipine and coreg, wean esmolol and cleviprex gtt - Monitor for s/s of bleeding - f/u UDS - IV hydration -  High risk for needing HD, discussed with patient and family  Best practice:  Diet: heart healthy Pain/Anxiety/Delirium protocol (if indicated): PRN fent VAP protocol (if indicated): na DVT prophylaxis: baby ASA per vascular GI prophylaxis: na Glucose control: monitor Mobility: BR Code Status: Full  Family Communication: pt and family updated at bedside Disposition: ICU for now    The patient is critically ill with multiple organ systems failure and requires high complexity decision making for assessment and support, frequent evaluation and titration of therapies, application of advanced monitoring technologies and extensive interpretation of multiple databases. Critical Care Time devoted to patient care services described in this note independent of APP/resident time (if applicable)  is 34 minutes.   Myrla Halsted MD [facility] 09/10/2020 1:27 PM Personal pager: [phone] If unanswered, please page CCM On-call: #[phone]

## 2020-09-10 NOTE — Progress Notes (Signed)
  Echocardiogram Echocardiogram Transesophageal has been performed.  Gerda Diss 09/10/2020, 12:38 AM

## 2020-09-10 NOTE — Anesthesia Postprocedure Evaluation (Signed)
Anesthesia Post Note  Patient: [name]  Procedure(s) Performed: Ultrasound Guided Right Common Femoral Artery Access.  Intravascular Ultrasound of Right Iliac Artery, Abdominal Aorta, Thoracic Aorta, Ascending and Descending  Aorta.  Right Iliac Arteriogram with Right Femoral Artery Perclose.  Insertion Thoracic Endovascular Stent Graft (N/A )     Patient location during evaluation: PACU Anesthesia Type: General Level of consciousness: awake and alert, patient cooperative and oriented Pain management: pain level controlled Vital Signs Assessment: post-procedure vital signs reviewed and stable Respiratory status: spontaneous breathing, nonlabored ventilation, respiratory function stable and patient connected to nasal cannula oxygen Cardiovascular status: stable (still requiring Cleviprex and Esmolol for BP control) Postop Assessment: no apparent nausea or vomiting Anesthetic complications: no   No complications documented.  Last Vitals:  Vitals:   09/10/20 0200 09/10/20 0215  BP:  136/81  Pulse:  (!) 59  Resp:  15  Temp: (!) 36.1 C   SpO2:  96%    Last Pain:  Vitals:   09/10/20 0230  TempSrc:   PainSc: 0-No pain                 Garison Genova,E. Neala Miggins

## 2020-09-10 NOTE — Op Note (Addendum)
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 Cephus Shelling, MD  Assistant: Doreatha Massed, PA  Procedure: 1.  Ultrasound-guided access of right common femoral artery with percutaneous closure for delivery of endografty 2.  Intravascular ultrasound (IVUS) of right iliac artery, abdominal aorta, descending thoracic aorta, aortic arch, and ascending aorta 3.  Thoracic aortogram 4.  Endovascular stent graft of descending thoracic aortic dissection without coverage of the left subclavian artery (31 mm x 31 mm x 20 cm Gore thoracic endograft) 5.  Right iliac arteriogram  Indications: Patient is a 34 year old female who presents as a transfer from [facility] with concern for type B aortic dissection ongoing for the last 2 weeks with significant mediastinal hematoma concerning for contained rupture.  She was also found to be profoundly anemic with a hemoglobin of 5 and acute renal failure with creatinine >10.  Suspect she has profound hypoperfusion of her kidneys from aortic dissection and compressed true lumen.  Risk benefits were discussed with the patient.  An assistant was needed for exposure and to expedite the case.  Findings: Percutaneous access of right common femoral artery.  Initial IVUS showed dissection in the descending thoracic aorta as well as the abdominal aorta throughout the visceral segment as demonstrated on CT with a fenestration around the diaphragmatic hiatus.  When we evaluated the aortic arch and the ascsending aorta we were concerned that there was evidence of a type a dissection.  Ultimately a TEE was performed in the operating room with findings documented per anesthesia.  I did call Dr. Cliffton Asters with CT surgery to come evaluate the  images with me to make the best decision and help rule out a type a dissection prior to proceeding with stent graft.  On further evaluation after TEE it was felt that this was likely just hematoma in the arch and the dissection itself started in the proximal descending just past the subclavian artery.  Ultimately endovascular stent graft was placed from the descending thoracic aorta without coverage of the left subclavian artery down to about 2 cm above the celiac artery using a 31 mm x 31 mm x 20 cm Gore endograft.  After stent graft we had preservation of the left subclavian and initial thoracic aortogram showed some filling of the false lumen but this was because our pigtail catheter was still outside the stent graft.  Once we rewired our catheter into the stent graft itself there was no evidence of filling of the false lumen and persistent dissection in the visceral segment of the abdominal aorta with improved true lumen diameter and no distal extravasation.  We could visualize the celiac and SMA arteries fill.  There was very poor filling of the renal arteries bilaterally.  Anesthesia: General  EBL: < 100 mL  Details: Patient was taken to the operating room after informed consent was obtained.  Placed on the operative table supine position.  General endotracheal anesthesia was induced.  She got preoperative antibiotics.  Her abdomen and groins were prepped and draped in usual sterile fashion.  Initially evaluated the right common femoral artery with ultrasound it was patent and image was saved.  This was accessed with a micro access needle, placed a microwire, and then ultimately made a stab incision over the wire dilated down the skin track over  top of the artery anteriorly with a hemostat.  Then placed a Bentson wire and 8 French dilator in the right common femoral artery and then a Perclose devices was deployed at 10:00 and 2:00.  That point time I then inserted a pigtail catheter and spun this to stay  in the true lumen of the dissection and out of the fenestration and I was able to get my catheter all the way up into the ascending aorta from right femoral access.  I then placed a double curve Lundy wire.  We then performed IVUS of the right iliac, abdominal aorta, descending thoracic aorta, aortic arch and ascending aorta.  Pertinent findings noted above but we did confirm dissection in the descending thoracic aorta and abdominal aorta with significant hematoma.  There appeared to be a possible type a dissection given we saw what looked like a false lumen in the ascending aorta as well on IVUS.  That point in time a TEE probe was used to perform TEE by anesthesia.  On initial evaluation there was concern for possible intimal flap.  I did call Dr. Cliffton Asters with CT surgery to come help me evaluate and make the best decision moving forward.  On further evaluation once we had pulled our wires back in the ascending aorta and further IVUS and evaluation with Dr. Cliffton Asters in discussion we felt safe that this was a type B dissection with some hematoma around the arch and potentially the asending aorta but no evidence of a type a dissection.  That point in time patient was given 100 units per kilogram heparin.  I then exchanged for a 20 [place] Gore dry seal sheath in the right common femoral artery.  We then went ahead and advanced our device up into the descending thoracic aorta and then buddy wired a pigtail catheter.  Pigtail catheter was used to mark the left subclavian artery and the graft was approximated just distal to the left subclavian artery and deployed in the descending thoracic aorta after releasing all the restraining loops.  Another thoracic aortogram confirmed that we had preserved left subclavian with good filling of the stent graft.  We did see some filling in the false lumen but this was because our catheter was still outside the stent graft.  Once we pulled this back and rewired into the stent graft  with our catheter we got a distal abdominal aortogram that showed preservation of the celiac and SMA with no distal extravasation and improved lumen diameter of the true lumen in the dissection distally in the visceral segment.  There was also faint filling of the renal arteries.  Satisfied with the results wires and catheters were removed and we exchanged for a Bentson wire.  The right femoral sheath was removed and we tied down Perclose at 10:00 and 2:00.  We had signals in the right foot and protamine was given.  She will be taken to ICU in critical condition.  Complication: None  Condition: Critically ill  Cephus Shelling, MD [facility] Office: [phone]   Cephus Shelling

## 2020-09-10 NOTE — H&P (Deleted)
This is a progress note   NAME:  Glenda Fuller, MRN:  [id number], DOB:  1986-03-29, LOS: 1 ADMISSION DATE:  08/28/2020, CONSULTATION DATE:  09/03/2020 REFERRING MD:  Adela Lank- [facility] EM, CHIEF COMPLAINT:  Back pain  // type B aortic dissection   Brief History   34 to F at [facility] ED found to have type B aortic dissection with mediastinal hematoma   History of present illness   34 yo F PMH HTN presents to [facility] ED from Urgent Care referral due to anemia Hgb 5.9. Pt initially presented to UC for worsening back pain, fatigue and SOB. Back pain began 2 weeks ago while pushing shopping cart, throbbing in nature and initially worsened with movement and improved with rest. 10/16 pain has moved, extending up the mid back. Labs revealed significant anemia as well as significant AKI. A CT renal stone study was obtained which was concerning for possible aortic dissection.CTA chest and pelvis revealed type B aortic dissection with rupture and mediastinal hematoma.  Pt BP 228/116 on presentation to [facility]. Started on esmolol and clevidipine infusions and is to be transferred to [facility] ED for admission to ICU.   Significant [facility] labs:  Hgb 5.4 HCT 16.1, hsTrop 338 Na 131 K 3.3 BUN 118 Cr 10.12 AG 20  Past Medical History  HTN  Significant Hospital Events   10/16 Referred to [facility] ED from UC for anemia. Found to have type B aortic dissection. On Esmolol and clevidipine for SBP > 200   Consults:  Vascular surgery   Procedures:  10/17 Procedure: 1.  Ultrasound-guided access of right common femoral artery with percutaneous closure for delivery of endografty 2.  Intravascular ultrasound (IVUS) of right iliac artery, abdominal aorta, descending thoracic aorta, aortic arch, and ascending aorta 3.  Thoracic aortogram 4.  Endovascular stent graft of descending thoracic aortic dissection without coverage of the left subclavian artery (31 mm x 31 mm x 20 cm Gore thoracic endograft) 5.  Right iliac arteriogram  Significant  Diagnostic Tests:  CT renal stone study> suspected aortic dissection originating in chest witn mediastinal hematoma. Hypertrophic LV. 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 Small bilateral pleural effusion, mild pulmonary edema  CTA c/a/p> Moderate hematoma extending from isthmus of aorta nearly to diaphragm. LV hypertrophy. Aortic Dissection beginning at isthmus of aorta and continuing into abdomen. False lumen compresses true lumen. Dissection terminates at livel of aortic bifurcation. True lumen serves celiac axis, SMA, bilateral renal arteries   Micro Data:  SARS Cov2> neg  Flu A / B> neg  Antimicrobials:    Interim history/subjective:  No events. Sleepy and hungry this AM.  Objective   Blood pressure 105/75, pulse (!) 46, temperature 97.7 F (36.5 C), resp. rate 17, height 5' (1.524 m), weight 44.9 kg, last menstrual period 07/30/2020, SpO2 94 %.        Intake/Output Summary (Last 24 hours) at 09/10/2020 1100 Last data filed at 09/10/2020 0801 Gross per 24 hour  Intake 4458.79 ml  Output 155 ml  Net 4303.79 ml   Filed Weights   09/02/2020 1508 09/10/20 0322  Weight: 39.1 kg 44.9 kg    Examination: Young woman in no acute distress Mucous membranes are dry Heart sounds are regular, extremities are warm Moves all 4 extremities to command Femoral access sites with no evidence of hematoma  Creatinine remains markedly elevated, potassium fine, hemoglobin stable at present  Resolved Hospital Problem list     Assessment & Plan:   Type B aortic dissection with  significant kidney injury and mediastinal hematoma- s/p repair by Dr. Chestine Spore on 09/10/20 Hypertensive Emergency, Hx poorly controlled HTN- requiring titrate of esmolol and cleviprex gtt, SBP goal < 120 , HR goal <60 ABLA- from mediastinal hematoma improved after transfusion  - Start diet - Start amlodipine and coreg, wean esmolol and cleviprex gtt - Monitor for s/s of bleeding - f/u UDS - IV  hydration - High risk for needing HD, discussed with patient and family  Best practice:  Diet: heart healthy Pain/Anxiety/Delirium protocol (if indicated): PRN fent VAP protocol (if indicated): na DVT prophylaxis: baby ASA per vascular GI prophylaxis: na Glucose control: monitor Mobility: BR Code Status: Full  Family Communication: pt and family updated at bedside Disposition: ICU for now    The patient is critically ill with multiple organ systems failure and requires high complexity decision making for assessment and support, frequent evaluation and titration of therapies, application of advanced monitoring technologies and extensive interpretation of multiple databases. Critical Care Time devoted to patient care services described in this note independent of APP/resident time (if applicable)  is 34 minutes.   Myrla Halsted MD [facility] 09/10/2020 1:27 PM Personal pager: [phone] If unanswered, please page CCM On-call: #[phone]

## 2020-09-10 NOTE — Progress Notes (Addendum)
PCCM Brief Progress Note  34 yo F with type B aortic dissection of descending thoracic aorta, acute renal failure, hypertensive emergency who is POD 0 endovascular stent graft of descending thoracic aortic dissection.   Seen in ICU following OR. Pt extubated, continues on esmolol and clevidipine infusions.   BP 144/84 HR 60 RR 16 SpO2 98% on 2LNC T 35.6  General- thin wdwn adult F supine NAD HEENT- NCAT slight periorbital edema. Pink mm trachea midline CV- RRR. 2+ radial pulses 1+ pedal pulses.  Pulm- CTAb symmetrical chest expansion on 2LNC GI- soft flat ndnt GU-foley with scant UOP  MSK- No obvious deformity, no extremity edema.  Neuro- drowsy, awakens to stimulation, disoriented but can follow simple commands before drifting to sleep   Type B aortic dissection of descending thoracic aorta with mediastinal hematoma, s/p endovascular stent graft  P -continue esmolol and clevidipine -SBP <120 (target 100-120) -HR goal < 60 (target 50-60)  -Bedrest, flat in bed  -PRN dilaudid, percocet -post-op per vascular surgery  AKI with oliguria -renal malperfusion 2/2 dissection above AGMA P -trend renal indices, UOP -will give amp of bicarb -Not seeing firm indication for urgent HD now, continue to evaluate. Wouldn't be surprised if ends up needing HD  Acute anemia, improved -s/p 3 PRBC P -trend CBC   Hypocalcemia P -replace   Hypertensive emergency Hx poorly controlled HTN P -cleviprex and esmolol as above -will need sufficient home reg prior to discharge, education RE importance of compliance  -follow up UDS    Critical Care Time: 35 min  Tessie Fass MSN, AGACNP-BC [facility] [phone] 09/10/2020, 3:27 AM

## 2020-09-10 NOTE — Progress Notes (Signed)
eLink Physician-Brief Progress Note Patient Name: Glenda Fuller DOB: Apr 11, 1986 MRN: [id number]   Date of Service  09/10/2020  HPI/Events of Note  Patient having moderate pain but doesn't feel like she can take the oral oxycodone/APAP ordered. Requests IV equivalent.   eICU Interventions  Ordered Dilaudid 0.25mg  IV Q2H PRN moderate pain. Discontinued oral oxycodone/APAP for now.     Intervention Category Intermediate Interventions: Pain - evaluation and management  Marveen Reeks Jermani Eberlein 09/10/2020, 10:41 PM

## 2020-09-10 NOTE — Progress Notes (Signed)
Called Glenda Fuller and spoke with Jonny Ruiz, RN and made him aware that patient doesn't want to take oral oxycodone because she doesn't feel like she can eat right now and doesn't want to get nauseated from pain medication, and is asking for something IV for pain.  Dilaudid is ordered but for severe pain and patient is rating her pain 5/10.  [facility] RN to relay message to Dr. Benjamin Stain.

## 2020-09-10 NOTE — Progress Notes (Signed)
eLink Physician-Brief Progress Note Patient Name: Glenda Fuller DOB: 07-03-1986 MRN: [id number]   Date of Service  09/10/2020  HPI/Events of Note  Hypertension - BP = 141/77 by A-line. Goal SBP < 120. Already on Esmolol IV infusion at 200 meg/kg/min with HR = 52 and Cleviprex IV infusion at ceiling dose.   eICU Interventions  Plan: 1. Hydralazine 10-20 mg IV Q 4 hours PRN SBP > 120.      Intervention Category Major Interventions: Hypertension - evaluation and management  Yama Nielson Eugene 09/10/2020, 5:05 AM

## 2020-09-11 ENCOUNTER — Inpatient Hospital Stay (HOSPITAL_COMMUNITY): Payer: Medicaid Other

## 2020-09-11 ENCOUNTER — Inpatient Hospital Stay (HOSPITAL_COMMUNITY): Payer: Medicaid Other | Admitting: Anesthesiology

## 2020-09-11 ENCOUNTER — Encounter (HOSPITAL_COMMUNITY): Admission: EM | Disposition: E | Payer: Self-pay | Source: Home / Self Care | Attending: Pulmonary Disease

## 2020-09-11 ENCOUNTER — Inpatient Hospital Stay (HOSPITAL_COMMUNITY): Payer: Medicaid Other | Admitting: Certified Registered"

## 2020-09-11 ENCOUNTER — Encounter (HOSPITAL_COMMUNITY): Payer: Self-pay | Admitting: Vascular Surgery

## 2020-09-11 DIAGNOSIS — N179 Acute kidney failure, unspecified: Secondary | ICD-10-CM

## 2020-09-11 DIAGNOSIS — K661 Hemoperitoneum: Secondary | ICD-10-CM | POA: Diagnosis not present

## 2020-09-11 DIAGNOSIS — J9601 Acute respiratory failure with hypoxia: Secondary | ICD-10-CM

## 2020-09-11 DIAGNOSIS — I7101 Dissection of thoracic aorta: Secondary | ICD-10-CM | POA: Diagnosis not present

## 2020-09-11 DIAGNOSIS — G9511 Acute infarction of spinal cord (embolic) (nonembolic): Secondary | ICD-10-CM

## 2020-09-11 DIAGNOSIS — R578 Other shock: Secondary | ICD-10-CM

## 2020-09-11 DIAGNOSIS — N170 Acute kidney failure with tubular necrosis: Secondary | ICD-10-CM | POA: Diagnosis not present

## 2020-09-11 DIAGNOSIS — N19 Unspecified kidney failure: Secondary | ICD-10-CM

## 2020-09-11 DIAGNOSIS — Z95828 Presence of other vascular implants and grafts: Secondary | ICD-10-CM

## 2020-09-11 DIAGNOSIS — I7103 Dissection of thoracoabdominal aorta: Secondary | ICD-10-CM

## 2020-09-11 DIAGNOSIS — D649 Anemia, unspecified: Secondary | ICD-10-CM

## 2020-09-11 HISTORY — PX: APPLICATION OF WOUND VAC: SHX5189

## 2020-09-11 HISTORY — PX: INSERTION OF DIALYSIS CATHETER: SHX1324

## 2020-09-11 HISTORY — PX: ULTRASOUND GUIDANCE FOR VASCULAR ACCESS: SHX6516

## 2020-09-11 HISTORY — PX: AORTOGRAM: SHX6300

## 2020-09-11 HISTORY — PX: ABDOMINAL AORTIC ENDOVASCULAR STENT GRAFT: SHX5707

## 2020-09-11 HISTORY — PX: LAPAROTOMY: SHX154

## 2020-09-11 LAB — POCT I-STAT 7, (LYTES, BLD GAS, ICA,H+H)
Acid-base deficit: 12 mmol/L — ABNORMAL HIGH (ref 0.0–2.0)
Acid-base deficit: 12 mmol/L — ABNORMAL HIGH (ref 0.0–2.0)
Acid-base deficit: 9 mmol/L — ABNORMAL HIGH (ref 0.0–2.0)
Bicarbonate: 14.7 mmol/L — ABNORMAL LOW (ref 20.0–28.0)
Bicarbonate: 15.2 mmol/L — ABNORMAL LOW (ref 20.0–28.0)
Bicarbonate: 16.1 mmol/L — ABNORMAL LOW (ref 20.0–28.0)
Calcium, Ion: 1.05 mmol/L — ABNORMAL LOW (ref 1.15–1.40)
Calcium, Ion: 0.99 mmol/L — ABNORMAL LOW (ref 1.15–1.40)
Calcium, Ion: 0.91 mmol/L — ABNORMAL LOW (ref 1.15–1.40)
HCT: 35 % — ABNORMAL LOW (ref 36.0–46.0)
HCT: 30 % — ABNORMAL LOW (ref 36.0–46.0)
HCT: 34 % — ABNORMAL LOW (ref 36.0–46.0)
Hemoglobin: 11.9 g/dL — ABNORMAL LOW (ref 12.0–15.0)
Hemoglobin: 10.2 g/dL — ABNORMAL LOW (ref 12.0–15.0)
Hemoglobin: 11.6 g/dL — ABNORMAL LOW (ref 12.0–15.0)
O2 Saturation: 93 %
O2 Saturation: 100 %
O2 Saturation: 91 %
Patient temperature: 98
Patient temperature: 98
Patient temperature: 97.8
Potassium: 3.3 mmol/L — ABNORMAL LOW (ref 3.5–5.1)
Potassium: 2.7 mmol/L — CL (ref 3.5–5.1)
Potassium: 3.1 mmol/L — ABNORMAL LOW (ref 3.5–5.1)
Sodium: 130 mmol/L — ABNORMAL LOW (ref 135–145)
Sodium: 127 mmol/L — ABNORMAL LOW (ref 135–145)
Sodium: 131 mmol/L — ABNORMAL LOW (ref 135–145)
TCO2: 16 mmol/L — ABNORMAL LOW (ref 22–32)
TCO2: 16 mmol/L — ABNORMAL LOW (ref 22–32)
TCO2: 17 mmol/L — ABNORMAL LOW (ref 22–32)
pCO2 arterial: 37.1 mmHg (ref 32.0–48.0)
pCO2 arterial: 36.5 mmHg (ref 32.0–48.0)
pCO2 arterial: 31.3 mmHg — ABNORMAL LOW (ref 32.0–48.0)
pH, Arterial: 7.205 — ABNORMAL LOW (ref 7.350–7.450)
pH, Arterial: 7.227 — ABNORMAL LOW (ref 7.350–7.450)
pH, Arterial: 7.317 — ABNORMAL LOW (ref 7.350–7.450)
pO2, Arterial: 80 mmHg — ABNORMAL LOW (ref 83.0–108.0)
pO2, Arterial: 266 mmHg — ABNORMAL HIGH (ref 83.0–108.0)
pO2, Arterial: 64 mmHg — ABNORMAL LOW (ref 83.0–108.0)

## 2020-09-11 LAB — POCT I-STAT, CHEM 8
BUN: 103 mg/dL — ABNORMAL HIGH (ref 6–20)
Calcium, Ion: 1.09 mmol/L — ABNORMAL LOW (ref 1.15–1.40)
Chloride: 100 mmol/L (ref 98–111)
Creatinine, Ser: 8.9 mg/dL — ABNORMAL HIGH (ref 0.44–1.00)
Glucose, Bld: 68 mg/dL — ABNORMAL LOW (ref 70–99)
HCT: 34 % — ABNORMAL LOW (ref 36.0–46.0)
Hemoglobin: 11.6 g/dL — ABNORMAL LOW (ref 12.0–15.0)
Potassium: 3.3 mmol/L — ABNORMAL LOW (ref 3.5–5.1)
Sodium: 129 mmol/L — ABNORMAL LOW (ref 135–145)
TCO2: 15 mmol/L — ABNORMAL LOW (ref 22–32)

## 2020-09-11 LAB — CBC
HCT: 34 % — ABNORMAL LOW (ref 36.0–46.0)
HCT: 41.7 % (ref 36.0–46.0)
Hemoglobin: 12.7 g/dL (ref 12.0–15.0)
Hemoglobin: 14.1 g/dL (ref 12.0–15.0)
MCH: 31.4 pg (ref 26.0–34.0)
MCH: 27.1 pg (ref 26.0–34.0)
MCHC: 37.4 g/dL — ABNORMAL HIGH (ref 30.0–36.0)
MCHC: 33.8 g/dL (ref 30.0–36.0)
MCV: 84 fL (ref 80.0–100.0)
MCV: 80.2 fL (ref 80.0–100.0)
Platelets: 233 10*3/uL (ref 150–400)
Platelets: 264 10*3/uL (ref 150–400)
RBC: 4.05 MIL/uL (ref 3.87–5.11)
RBC: 5.2 MIL/uL — ABNORMAL HIGH (ref 3.87–5.11)
RDW: 17 % — ABNORMAL HIGH (ref 11.5–15.5)
RDW: 22.6 % — ABNORMAL HIGH (ref 11.5–15.5)
WBC: 12.3 10*3/uL — ABNORMAL HIGH (ref 4.0–10.5)
WBC: 4.7 10*3/uL (ref 4.0–10.5)
nRBC: 2.1 % — ABNORMAL HIGH (ref 0.0–0.2)
nRBC: 15.5 % — ABNORMAL HIGH (ref 0.0–0.2)

## 2020-09-11 LAB — GLUCOSE, CAPILLARY
Glucose-Capillary: 106 mg/dL — ABNORMAL HIGH (ref 70–99)
Glucose-Capillary: 137 mg/dL — ABNORMAL HIGH (ref 70–99)
Glucose-Capillary: 138 mg/dL — ABNORMAL HIGH (ref 70–99)
Glucose-Capillary: 59 mg/dL — ABNORMAL LOW (ref 70–99)
Glucose-Capillary: 63 mg/dL — ABNORMAL LOW (ref 70–99)
Glucose-Capillary: 68 mg/dL — ABNORMAL LOW (ref 70–99)
Glucose-Capillary: 71 mg/dL (ref 70–99)
Glucose-Capillary: 81 mg/dL (ref 70–99)

## 2020-09-11 LAB — COMPREHENSIVE METABOLIC PANEL
ALT: 35 U/L (ref 0–44)
ALT: 23 U/L (ref 0–44)
AST: 63 U/L — ABNORMAL HIGH (ref 15–41)
AST: 68 U/L — ABNORMAL HIGH (ref 15–41)
Albumin: 2.6 g/dL — ABNORMAL LOW (ref 3.5–5.0)
Albumin: 2.1 g/dL — ABNORMAL LOW (ref 3.5–5.0)
Alkaline Phosphatase: 114 U/L (ref 38–126)
Alkaline Phosphatase: 86 U/L (ref 38–126)
Anion gap: 23 — ABNORMAL HIGH (ref 5–15)
Anion gap: 18 — ABNORMAL HIGH (ref 5–15)
BUN: 94 mg/dL — ABNORMAL HIGH (ref 6–20)
BUN: 94 mg/dL — ABNORMAL HIGH (ref 6–20)
CO2: 9 mmol/L — ABNORMAL LOW (ref 22–32)
CO2: 12 mmol/L — ABNORMAL LOW (ref 22–32)
Calcium: 8 mg/dL — ABNORMAL LOW (ref 8.9–10.3)
Calcium: 6.5 mg/dL — ABNORMAL LOW (ref 8.9–10.3)
Chloride: 97 mmol/L — ABNORMAL LOW (ref 98–111)
Chloride: 99 mmol/L (ref 98–111)
Creatinine, Ser: 7.81 mg/dL — ABNORMAL HIGH (ref 0.44–1.00)
Creatinine, Ser: 7.7 mg/dL — ABNORMAL HIGH (ref 0.44–1.00)
GFR, Estimated: 6 mL/min — ABNORMAL LOW (ref >60)
GFR, Estimated: 6 mL/min — ABNORMAL LOW (ref >60)
Glucose, Bld: 71 mg/dL (ref 70–99)
Glucose, Bld: 102 mg/dL — ABNORMAL HIGH (ref 70–99)
Potassium: 3.8 mmol/L (ref 3.5–5.1)
Potassium: 3.7 mmol/L (ref 3.5–5.1)
Sodium: 129 mmol/L — ABNORMAL LOW (ref 135–145)
Sodium: 129 mmol/L — ABNORMAL LOW (ref 135–145)
Total Bilirubin: 1.2 mg/dL (ref 0.3–1.2)
Total Bilirubin: 1.1 mg/dL (ref 0.3–1.2)
Total Protein: 5.3 g/dL — ABNORMAL LOW (ref 6.5–8.1)
Total Protein: 4.5 g/dL — ABNORMAL LOW (ref 6.5–8.1)

## 2020-09-11 LAB — RENAL FUNCTION PANEL
Albumin: 1.7 g/dL — ABNORMAL LOW (ref 3.5–5.0)
Anion gap: 21 — ABNORMAL HIGH (ref 5–15)
BUN: 94 mg/dL — ABNORMAL HIGH (ref 6–20)
CO2: 12 mmol/L — ABNORMAL LOW (ref 22–32)
Calcium: 5.7 mg/dL — CL (ref 8.9–10.3)
Chloride: 98 mmol/L (ref 98–111)
Creatinine, Ser: 7.63 mg/dL — ABNORMAL HIGH (ref 0.44–1.00)
GFR, Estimated: 6 mL/min — ABNORMAL LOW (ref >60)
Glucose, Bld: 70 mg/dL (ref 70–99)
Phosphorus: 9.1 mg/dL — ABNORMAL HIGH (ref 2.5–4.6)
Potassium: UNDETERMINED mmol/L (ref 3.5–5.1)
Sodium: 131 mmol/L — ABNORMAL LOW (ref 135–145)

## 2020-09-11 LAB — HEMOGLOBIN A1C
Hgb A1c MFr Bld: 4.8 % (ref 4.8–5.6)
Mean Plasma Glucose: 91 mg/dL

## 2020-09-11 LAB — LIPID PANEL
Cholesterol: 102 mg/dL (ref 0–200)
LDL Cholesterol: UNDETERMINED mg/dL (ref 0–99)
Triglycerides: 3914 mg/dL — ABNORMAL HIGH (ref <150)
VLDL: UNDETERMINED mg/dL (ref 0–40)

## 2020-09-11 LAB — PREPARE RBC (CROSSMATCH)

## 2020-09-11 LAB — TRIGLYCERIDES
Triglycerides: 4343 mg/dL — ABNORMAL HIGH (ref <150)
Triglycerides: 1789 mg/dL — ABNORMAL HIGH (ref <150)

## 2020-09-11 LAB — MAGNESIUM: Magnesium: 2 mg/dL (ref 1.7–2.4)

## 2020-09-11 LAB — LDL CHOLESTEROL, DIRECT: Direct LDL: 37.9 mg/dL (ref 0–99)

## 2020-09-11 SURGERY — AORTOGRAM
Anesthesia: General | Site: Groin | Laterality: Right

## 2020-09-11 MED ORDER — CARVEDILOL 6.25 MG PO TABS: 6.2500 mg | ORAL_TABLET | Freq: Two times a day (BID) | ORAL | Status: DC

## 2020-09-11 MED ORDER — PANTOPRAZOLE SODIUM 40 MG PO PACK
40.0000 mg | PACK | Freq: Every day | ORAL | Status: DC
  Administered 2020-09-12: 40 mg
  Filled 2020-09-11: qty 20

## 2020-09-11 MED ORDER — HEPARIN SODIUM (PORCINE) 5000 UNIT/ML IJ SOLN
5000.0000 [IU] | Freq: Three times a day (TID) | INTRAMUSCULAR | Status: DC
  Administered 2020-09-12: 5000 [IU] via SUBCUTANEOUS
  Filled 2020-09-11: qty 1

## 2020-09-11 MED ORDER — DOCUSATE SODIUM 50 MG/5ML PO LIQD: 100.0000 mg | Freq: Two times a day (BID) | ORAL | Status: DC

## 2020-09-11 MED ORDER — ETOMIDATE 2 MG/ML IV SOLN
20.0000 mg | Freq: Once | INTRAVENOUS | Status: AC
  Administered 2020-09-11: 20 mg via INTRAVENOUS

## 2020-09-11 MED ORDER — ROCURONIUM BROMIDE 10 MG/ML (PF) SYRINGE
PREFILLED_SYRINGE | INTRAVENOUS | Status: DC | PRN
  Administered 2020-09-11: 20 mg via INTRAVENOUS
  Administered 2020-09-11: 30 mg via INTRAVENOUS

## 2020-09-11 MED ORDER — CEFAZOLIN SODIUM-DEXTROSE 2-3 GM-%(50ML) IV SOLR
INTRAVENOUS | Status: DC | PRN
  Administered 2020-09-11: 2 g via INTRAVENOUS

## 2020-09-11 MED ORDER — PROPOFOL 10 MG/ML IV BOLUS
INTRAVENOUS | Status: AC
  Filled 2020-09-11: qty 20

## 2020-09-11 MED ORDER — LABETALOL HCL 5 MG/ML IV SOLN: 10.0000 mg | INTRAVENOUS | Status: DC | PRN

## 2020-09-11 MED ORDER — FENTANYL 2500MCG IN NS 250ML (10MCG/ML) PREMIX INFUSION
0.0000 ug/h | INTRAVENOUS | Status: DC
  Administered 2020-09-11: 300 ug/h via INTRAVENOUS
  Administered 2020-09-11 – 2020-09-12 (×2): 400 ug/h via INTRAVENOUS
  Administered 2020-09-12: 200 ug/h via INTRAVENOUS
  Filled 2020-09-11 (×4): qty 250

## 2020-09-11 MED ORDER — FENTANYL CITRATE (PF) 100 MCG/2ML IJ SOLN: 50.0000 ug | Freq: Once | INTRAMUSCULAR | Status: DC

## 2020-09-11 MED ORDER — MIDAZOLAM HCL 2 MG/2ML IJ SOLN
2.0000 mg | INTRAMUSCULAR | Status: DC | PRN
  Administered 2020-09-12: 2 mg via INTRAVENOUS
  Filled 2020-09-11: qty 2

## 2020-09-11 MED ORDER — SODIUM CHLORIDE 0.9% FLUSH
3.0000 mL | Freq: Two times a day (BID) | INTRAVENOUS | Status: DC
  Administered 2020-09-11 – 2020-09-13 (×4): 3 mL via INTRAVENOUS

## 2020-09-11 MED ORDER — SUCCINYLCHOLINE CHLORIDE 200 MG/10ML IV SOSY
PREFILLED_SYRINGE | INTRAVENOUS | Status: AC
  Filled 2020-09-11: qty 10

## 2020-09-11 MED ORDER — MIDAZOLAM HCL 2 MG/2ML IJ SOLN
INTRAMUSCULAR | Status: AC
  Filled 2020-09-11: qty 2

## 2020-09-11 MED ORDER — SODIUM CHLORIDE 0.9 % IV SOLN: INTRAVENOUS | Status: DC | PRN

## 2020-09-11 MED ORDER — DEXTROSE 50 % IV SOLN
INTRAVENOUS | Status: AC
  Filled 2020-09-11: qty 50

## 2020-09-11 MED ORDER — PROTAMINE SULFATE 10 MG/ML IV SOLN
INTRAVENOUS | Status: AC
  Filled 2020-09-11: qty 25

## 2020-09-11 MED ORDER — SODIUM BICARBONATE 8.4 % IV SOLN
INTRAVENOUS | Status: DC | PRN
  Administered 2020-09-11: 100 meq via INTRAVENOUS

## 2020-09-11 MED ORDER — PRISMASOL BGK 4/2.5 32-4-2.5 MEQ/L IV SOLN: INTRAVENOUS | Status: DC

## 2020-09-11 MED ORDER — SODIUM CHLORIDE 0.9 % FOR CRRT: INTRAVENOUS_CENTRAL | Status: DC | PRN

## 2020-09-11 MED ORDER — HEPARIN SODIUM (PORCINE) 1000 UNIT/ML IJ SOLN
INTRAMUSCULAR | Status: DC | PRN
  Administered 2020-09-11: 5000 [IU] via INTRAVENOUS

## 2020-09-11 MED ORDER — ROCURONIUM BROMIDE 10 MG/ML (PF) SYRINGE
50.0000 mg | PREFILLED_SYRINGE | Freq: Once | INTRAVENOUS | Status: AC
  Administered 2020-09-11: 50 mg via INTRAVENOUS

## 2020-09-11 MED ORDER — POLYETHYLENE GLYCOL 3350 17 G PO PACK: 17.0000 g | PACK | Freq: Every day | ORAL | Status: DC | PRN

## 2020-09-11 MED ORDER — CEFAZOLIN SODIUM 1 G IJ SOLR
INTRAMUSCULAR | Status: AC
  Filled 2020-09-11: qty 20

## 2020-09-11 MED ORDER — LEVETIRACETAM IN NACL 1000 MG/100ML IV SOLN
1000.0000 mg | Freq: Two times a day (BID) | INTRAVENOUS | Status: DC
  Administered 2020-09-11 – 2020-09-13 (×5): 1000 mg via INTRAVENOUS
  Filled 2020-09-11 (×5): qty 100

## 2020-09-11 MED ORDER — HEPARIN SODIUM (PORCINE) 1000 UNIT/ML IJ SOLN
INTRAMUSCULAR | Status: AC
  Filled 2020-09-11: qty 2

## 2020-09-11 MED ORDER — POLYETHYLENE GLYCOL 3350 17 G PO PACK
17.0000 g | PACK | Freq: Every day | ORAL | Status: DC
  Administered 2020-09-12: 17 g
  Filled 2020-09-11: qty 1

## 2020-09-11 MED ORDER — DOCUSATE SODIUM 50 MG/5ML PO LIQD: 100.0000 mg | Freq: Two times a day (BID) | ORAL | Status: DC | PRN

## 2020-09-11 MED ORDER — SODIUM CHLORIDE 0.9 % IV SOLN
INTRAVENOUS | Status: DC | PRN
  Administered 2020-09-11: 500 mL

## 2020-09-11 MED ORDER — IOHEXOL 350 MG/ML SOLN
100.0000 mL | Freq: Once | INTRAVENOUS | Status: AC | PRN
  Administered 2020-09-11: 100 mL via INTRAVENOUS

## 2020-09-11 MED ORDER — LACTATED RINGERS IV SOLN: INTRAVENOUS | Status: DC | PRN

## 2020-09-11 MED ORDER — SODIUM CHLORIDE 0.9% FLUSH: 3.0000 mL | INTRAVENOUS | Status: DC | PRN

## 2020-09-11 MED ORDER — HYDRALAZINE HCL 25 MG PO TABS: 25.0000 mg | ORAL_TABLET | Freq: Four times a day (QID) | ORAL | Status: DC

## 2020-09-11 MED ORDER — SODIUM CHLORIDE 0.9% IV SOLUTION: Freq: Once | INTRAVENOUS | Status: AC

## 2020-09-11 MED ORDER — FENTANYL BOLUS VIA INFUSION
50.0000 ug | INTRAVENOUS | Status: DC | PRN
  Filled 2020-09-11: qty 50

## 2020-09-11 MED ORDER — FENTANYL CITRATE (PF) 100 MCG/2ML IJ SOLN
INTRAMUSCULAR | Status: DC | PRN
  Administered 2020-09-11 (×5): 50 ug via INTRAVENOUS

## 2020-09-11 MED ORDER — EPHEDRINE 5 MG/ML INJ
INTRAVENOUS | Status: AC
  Filled 2020-09-11: qty 20

## 2020-09-11 MED ORDER — PRISMASOL BGK 4/2.5 32-4-2.5 MEQ/L REPLACEMENT SOLN: Status: DC

## 2020-09-11 MED ORDER — MIDAZOLAM HCL 2 MG/2ML IJ SOLN: 2.0000 mg | INTRAMUSCULAR | Status: DC | PRN

## 2020-09-11 MED ORDER — SODIUM BICARBONATE 8.4 % IV SOLN
INTRAVENOUS | Status: AC
  Filled 2020-09-11: qty 50

## 2020-09-11 MED ORDER — MIDAZOLAM HCL 2 MG/2ML IJ SOLN
INTRAMUSCULAR | Status: AC
  Administered 2020-09-11: 2 mg
  Filled 2020-09-11: qty 2

## 2020-09-11 MED ORDER — CEFAZOLIN SODIUM-DEXTROSE 2-4 GM/100ML-% IV SOLN
INTRAVENOUS | Status: AC
  Filled 2020-09-11: qty 100

## 2020-09-11 MED ORDER — SODIUM CHLORIDE 0.9 % IV SOLN
INTRAVENOUS | Status: AC
  Filled 2020-09-11 (×2): qty 1.2

## 2020-09-11 MED ORDER — HEPARIN SODIUM (PORCINE) 1000 UNIT/ML DIALYSIS
1000.0000 [IU] | INTRAMUSCULAR | Status: DC | PRN
  Administered 2020-09-12: 2800 [IU] via INTRAVENOUS_CENTRAL
  Filled 2020-09-11: qty 6
  Filled 2020-09-11: qty 5
  Filled 2020-09-11: qty 6

## 2020-09-11 MED ORDER — INSULIN (MYXREDLIN) INFUSION FOR HYPERTRIGLYCERIDEMIA
0.0500 [IU]/kg/h | INTRAVENOUS | Status: DC
  Administered 2020-09-11: 0.1 [IU]/kg/h via INTRAVENOUS
  Filled 2020-09-11: qty 100

## 2020-09-11 MED ORDER — NOREPINEPHRINE 16 MG/250ML-% IV SOLN
0.0000 ug/min | INTRAVENOUS | Status: DC
  Administered 2020-09-11: 1 ug/min via INTRAVENOUS
  Administered 2020-09-12: 30 ug/min via INTRAVENOUS
  Administered 2020-09-12: 24 ug/min via INTRAVENOUS
  Filled 2020-09-11 (×3): qty 250

## 2020-09-11 MED ORDER — MIDAZOLAM HCL 5 MG/5ML IJ SOLN
INTRAMUSCULAR | Status: DC | PRN
  Administered 2020-09-11: 2 mg via INTRAVENOUS

## 2020-09-11 MED ORDER — CEFAZOLIN SODIUM-DEXTROSE 2-4 GM/100ML-% IV SOLN: 2.0000 g | Freq: Once | INTRAVENOUS | Status: DC

## 2020-09-11 MED ORDER — ASPIRIN 81 MG PO CHEW
81.0000 mg | CHEWABLE_TABLET | Freq: Every day | ORAL | Status: DC
  Administered 2020-09-12: 81 mg
  Filled 2020-09-11: qty 1

## 2020-09-11 MED ORDER — DEXTROSE 50 % IV SOLN
INTRAVENOUS | Status: AC
  Administered 2020-09-11: 12.5 g via INTRAVENOUS
  Filled 2020-09-11: qty 50

## 2020-09-11 MED ORDER — POLYETHYLENE GLYCOL 3350 17 G PO PACK: 17.0000 g | PACK | Freq: Every day | ORAL | Status: DC

## 2020-09-11 MED ORDER — PROPOFOL 1000 MG/100ML IV EMUL
INTRAVENOUS | Status: AC
  Filled 2020-09-11: qty 100

## 2020-09-11 MED ORDER — SODIUM CHLORIDE 0.9 % IV SOLN: 250.0000 mL | INTRAVENOUS | Status: DC | PRN

## 2020-09-11 MED ORDER — NOREPINEPHRINE 4 MG/250ML-% IV SOLN
INTRAVENOUS | Status: DC | PRN
  Administered 2020-09-11: 3 ug/min via INTRAVENOUS

## 2020-09-11 MED ORDER — AMLODIPINE BESYLATE 5 MG PO TABS: 5.0000 mg | ORAL_TABLET | Freq: Every day | ORAL | Status: DC

## 2020-09-11 MED ORDER — DOCUSATE SODIUM 50 MG/5ML PO LIQD
100.0000 mg | Freq: Two times a day (BID) | ORAL | Status: DC
  Administered 2020-09-12: 100 mg
  Filled 2020-09-11: qty 10

## 2020-09-11 MED ORDER — FENTANYL CITRATE (PF) 100 MCG/2ML IJ SOLN
INTRAMUSCULAR | Status: AC
  Administered 2020-09-11: 50 ug
  Filled 2020-09-11: qty 2

## 2020-09-11 MED ORDER — SODIUM BICARBONATE 8.4 % IV SOLN
50.0000 meq | Freq: Once | INTRAVENOUS | Status: AC
  Administered 2020-09-11: 50 meq via INTRAVENOUS
  Filled 2020-09-11: qty 50

## 2020-09-11 MED ORDER — 0.9 % SODIUM CHLORIDE (POUR BTL) OPTIME
TOPICAL | Status: DC | PRN
  Administered 2020-09-11: 1000 mL

## 2020-09-11 MED ORDER — DEXTROSE 10 % IV SOLN: INTRAVENOUS | Status: DC

## 2020-09-11 MED ORDER — PROTAMINE SULFATE 10 MG/ML IV SOLN
INTRAVENOUS | Status: DC | PRN
  Administered 2020-09-11: 5 mg via INTRAVENOUS
  Administered 2020-09-11 (×2): 10 mg via INTRAVENOUS

## 2020-09-11 MED ORDER — ROCURONIUM BROMIDE 10 MG/ML (PF) SYRINGE
PREFILLED_SYRINGE | INTRAVENOUS | Status: AC
  Filled 2020-09-11: qty 40

## 2020-09-11 MED ORDER — PROTAMINE SULFATE 10 MG/ML IV SOLN
INTRAVENOUS | Status: AC
  Filled 2020-09-11: qty 5

## 2020-09-11 MED ORDER — POTASSIUM CHLORIDE 20 MEQ/15ML (10%) PO SOLN
40.0000 meq | Freq: Once | ORAL | Status: AC
  Administered 2020-09-11: 40 meq
  Filled 2020-09-11: qty 30

## 2020-09-11 MED ORDER — DEXTROSE 50 % IV SOLN: 12.5000 g | INTRAVENOUS | Status: AC

## 2020-09-11 MED ORDER — CHLORHEXIDINE GLUCONATE 0.12% ORAL RINSE (MEDLINE KIT)
15.0000 mL | Freq: Two times a day (BID) | OROMUCOSAL | Status: DC
  Administered 2020-09-11 – 2020-09-13 (×4): 15 mL via OROMUCOSAL

## 2020-09-11 MED ORDER — PROPOFOL 500 MG/50ML IV EMUL
INTRAVENOUS | Status: DC | PRN
  Administered 2020-09-11: 25 ug/kg/min via INTRAVENOUS

## 2020-09-11 MED ORDER — ORAL CARE MOUTH RINSE
15.0000 mL | OROMUCOSAL | Status: DC
  Administered 2020-09-11 – 2020-09-13 (×18): 15 mL via OROMUCOSAL

## 2020-09-11 MED ORDER — IODIXANOL 320 MG/ML IV SOLN
INTRAVENOUS | Status: DC | PRN
  Administered 2020-09-11: 61.1 mL via INTRA_ARTERIAL

## 2020-09-11 MED ORDER — NOREPINEPHRINE 4 MG/250ML-% IV SOLN: 0.0000 ug/min | INTRAVENOUS | Status: DC

## 2020-09-11 MED ORDER — PHENYLEPHRINE 40 MCG/ML (10ML) SYRINGE FOR IV PUSH (FOR BLOOD PRESSURE SUPPORT)
PREFILLED_SYRINGE | INTRAVENOUS | Status: AC
  Filled 2020-09-11: qty 10

## 2020-09-11 MED FILL — Heparin Sodium (Porcine) Inj 1000 Unit/ML: INTRAMUSCULAR | Qty: 30 | Status: CN

## 2020-09-11 MED FILL — Heparin Sodium (Porcine) Inj 1000 Unit/ML: INTRAMUSCULAR | Qty: 2500 | Status: CN

## 2020-09-11 MED FILL — Potassium Chloride Inj 2 mEq/ML: INTRAVENOUS | Qty: 40 | Status: CN

## 2020-09-11 SURGICAL SUPPLY — 71 items
ADH SKN CLS APL DERMABOND .7 (GAUZE/BANDAGES/DRESSINGS) ×4
APL PRP STRL LF DISP 70% ISPRP (MISCELLANEOUS) ×4
BAG BANDED W/RUBBER/TAPE 36X54 (MISCELLANEOUS) ×5 IMPLANT
BAG DECANTER FOR FLEXI CONT (MISCELLANEOUS) ×5 IMPLANT
BAG EQP BAND 135X91 W/RBR TAPE (MISCELLANEOUS) ×4
CANISTER SUCT 3000ML PPV (MISCELLANEOUS) ×5 IMPLANT
CANISTER WOUNDNEG PRESSURE 500 (CANNISTER) ×5 IMPLANT
CATH BEACON 5 .035 65 KMP TIP (CATHETERS) ×5 IMPLANT
CATH BEACON 5.038 65CM KMP-01 (CATHETERS) IMPLANT
CATH OMNI FLUSH .035X70CM (CATHETERS) ×5 IMPLANT
CATH OMNI FLUSH 5F 65CM (CATHETERS) ×5 IMPLANT
CATH VISIONS PV .035 IVUS (CATHETERS) ×5 IMPLANT
CHLORAPREP W/TINT 26 (MISCELLANEOUS) ×5 IMPLANT
COVER PROBE W GEL 5X96 (DRAPES) ×5 IMPLANT
DERMABOND ADVANCED (GAUZE/BANDAGES/DRESSINGS) ×1
DERMABOND ADVANCED .7 DNX12 (GAUZE/BANDAGES/DRESSINGS) ×4 IMPLANT
DEVICE CLOSURE PERCLS PRGLD 6F (VASCULAR PRODUCTS) ×8 IMPLANT
DEVICE TORQUE KENDALL .025-038 (MISCELLANEOUS) ×5 IMPLANT
DRAPE BRACHIAL (DRAPES) ×5 IMPLANT
DRSG TEGADERM 2-3/8X2-3/4 SM (GAUZE/BANDAGES/DRESSINGS) ×10 IMPLANT
DRSG TEGADERM 4X4.75 (GAUZE/BANDAGES/DRESSINGS) ×5 IMPLANT
DRYSEAL FLEXSHEATH 20FR 33CM (SHEATH) ×1
ELECT BLADE 4.0 EZ CLEAN MEGAD (MISCELLANEOUS) ×5
ELECT REM PT RETURN 9FT ADLT (ELECTROSURGICAL) ×5
ELECTRODE BLDE 4.0 EZ CLN MEGD (MISCELLANEOUS) ×4 IMPLANT
ELECTRODE REM PT RTRN 9FT ADLT (ELECTROSURGICAL) ×4 IMPLANT
FILTER CO2 0.2 MICRON (VASCULAR PRODUCTS) IMPLANT
FILTER CO2 INSUFFLATOR AX1008 (MISCELLANEOUS) IMPLANT
GAUZE SPONGE 2X2 8PLY NS (GAUZE/BANDAGES/DRESSINGS) ×5 IMPLANT
GAUZE SPONGE 2X2 8PLY STRL LF (GAUZE/BANDAGES/DRESSINGS) ×4 IMPLANT
GLOVE BIO SURGEON STRL SZ7.5 (GLOVE) ×5 IMPLANT
GLOVE BIOGEL PI IND STRL 8 (GLOVE) ×4 IMPLANT
GLOVE BIOGEL PI INDICATOR 8 (GLOVE) ×1
GLOVE ECLIPSE 7.5 STRL STRAW (GLOVE) ×10 IMPLANT
GLOVE INDICATOR 8.0 STRL GRN (GLOVE) ×5 IMPLANT
GOWN STRL REUS W/ TWL LRG LVL3 (GOWN DISPOSABLE) ×12 IMPLANT
GOWN STRL REUS W/ TWL XL LVL3 (GOWN DISPOSABLE) ×4 IMPLANT
GOWN STRL REUS W/TWL LRG LVL3 (GOWN DISPOSABLE) ×15
GOWN STRL REUS W/TWL XL LVL3 (GOWN DISPOSABLE) ×5
GUIDEWIRE ANGLED .035X150CM (WIRE) ×5 IMPLANT
HANDLE SUCTION POOLE (INSTRUMENTS) ×4 IMPLANT
KIT BASIN OR (CUSTOM PROCEDURE TRAY) ×5 IMPLANT
KIT TURNOVER KIT B (KITS) ×5 IMPLANT
NS IRRIG 1000ML POUR BTL (IV SOLUTION) ×5 IMPLANT
PACK ENDOVASCULAR (PACKS) ×5 IMPLANT
PAD ARMBOARD 7.5X6 YLW CONV (MISCELLANEOUS) ×10 IMPLANT
PENCIL BUTTON HOLSTER BLD 10FT (ELECTRODE) ×5 IMPLANT
PERCLOSE PROGLIDE 6F (VASCULAR PRODUCTS) ×10
SET MICROPUNCTURE 5F STIFF (MISCELLANEOUS) ×5 IMPLANT
SHEATH DRYSEAL FLEX 20FR 33CM (SHEATH) ×4 IMPLANT
SHEATH PINNACLE 5F 10CM (SHEATH) ×5 IMPLANT
SHEATH PINNACLE 8F 10CM (SHEATH) ×5 IMPLANT
SLEEVE ISOL F/PACE RF HD COVER (MISCELLANEOUS) ×5 IMPLANT
SPONGE ABDOMINAL VAC ABTHERA (MISCELLANEOUS) ×5 IMPLANT
SPONGE GAUZE 2X2 STER 10/PKG (GAUZE/BANDAGES/DRESSINGS) ×1
STENT GRFT THORAC ACS 31X31X10 (Endovascular Graft) ×5 IMPLANT
STOPCOCK MORSE 400PSI 3WAY (MISCELLANEOUS) ×10 IMPLANT
SUCTION POOLE HANDLE (INSTRUMENTS) ×5
SUT ETHILON 3 0 PS 1 (SUTURE) ×5 IMPLANT
SUT MNCRL AB 4-0 PS2 18 (SUTURE) ×10 IMPLANT
SUT PROLENE 5 0 C 1 24 (SUTURE) ×5 IMPLANT
SUT VIC AB 2-0 CTX 36 (SUTURE) IMPLANT
SUT VIC AB 3-0 SH 27 (SUTURE) ×5
SUT VIC AB 3-0 SH 27X BRD (SUTURE) ×4 IMPLANT
TOWEL GREEN STERILE (TOWEL DISPOSABLE) ×10 IMPLANT
TRAY FOLEY MTR SLVR 16FR STAT (SET/KITS/TRAYS/PACK) ×5 IMPLANT
TUBING HIGH PRESSURE 120CM (CONNECTOR) ×5 IMPLANT
WATER STERILE IRR 1000ML POUR (IV SOLUTION) IMPLANT
WIRE BENTSON .035X145CM (WIRE) ×5 IMPLANT
WIRE ROSEN-J .035X260CM (WIRE) ×5 IMPLANT
WIRE STIFF LUNDERQUIST 260CM (WIRE) ×5 IMPLANT

## 2020-09-11 NOTE — Anesthesia Procedure Notes (Signed)
Lumbar Drain  Start time: 09/16/2020 1:16 AM End time: 09/22/2020 1:50 AM Staffing Anesthesiologist: Heather Roberts, MD Preanesthetic Checklist Completed: patient identified, IV checked, risks and benefits discussed, surgical consent, monitors and equipment checked, pre-op evaluation and timeout performed Lumbar Puncture:  Patient position: right lateral decubitus Prep: DuraPrep Patient monitoring: heart rate, cardiac monitor, continuous pulse ox and blood pressure Approach: midline Location: L2-3 Injection technique: catheter Needle Needle type: Tuohy  Needle gauge: 16 G Needle length: 9 cm Catheter type: closed end Catheter size: 20 g Catheter at skin depth: 13 cm Assessment Events: cerebrospinal fluid Attempts: 1 CSF: clear Post Procedure: drain attached to lumbar drainage system, site cleaned and sterile dressing applied

## 2020-09-11 NOTE — Progress Notes (Signed)
Critical K of 2.7 per ABG reported to RN. RT will continue to monitor.

## 2020-09-11 NOTE — Progress Notes (Addendum)
34 year old female that underwent thoracic stent graft of her descending thoracic aorta on Saturday night for type dissection with contained rupture and malperfusion of her kidneys.  This morning she was sitting up in bed alert and hemodynamically stable but had ongoing motor deficits in her lower extremities that started last night.  She had an acute change in her exam today where she became hypotensive and ultimately required intubation.  Repeat CT scan shows good position of the descending thoracic stent graft from the left subclavian to just above the celiac.  She has diffuse anasarca bilateral pleural effusions and radiology is concerned that she is ruptured in her visceral segment distal to the stent graft.  I discussed with family that I think she is critically ill and at high risk for not surviving this event.  She was critically ill on arrival Saturday night as previously documented.  Discussed that if they want [place] to do everything would recommend abdominal aortogram and possible extending the stent graft including possible snorkel of the SMA with coverage of the celiac.  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  They wish to proceed.  She is posted emergently.  Cephus Shelling, MD [facility] Office: [phone]   Cephus Shelling

## 2020-09-11 NOTE — Progress Notes (Signed)
Dr. Krista Blue and Lurena Joiner, CRNA at bedside.  Dr. Krista Blue placing lumber spinal drain at this time. IV dilaudid given prior to insertion per CRNA instruction.

## 2020-09-11 NOTE — Anesthesia Preprocedure Evaluation (Signed)
Anesthesia Evaluation  Patient identified by MRN, date of birth, ID band Patient awake    Reviewed: Allergy & Precautions, NPO status , Patient's Chart, lab work & pertinent test resultsPreop documentation limited or incomplete due to emergent nature of procedure.  History of Anesthesia Complications Negative for: history of anesthetic complications  Airway Mallampati: I  TM Distance: >3 FB Neck ROM: Full    Dental  (+) Dental Advisory Given   Pulmonary  08/31/2020 SARS coronavirus NEG   breath sounds clear to auscultation       Cardiovascular hypertension, Pt. 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)   Rhythm:Regular Rate:Normal     Neuro/Psych Paraplegia    GI/Hepatic negative GI ROS, Neg liver ROS,   Endo/Other  negative endocrine ROS  Renal/GU ARFRenal disease (creat 10.12, K+ 3.3)     Musculoskeletal   Abdominal   Peds  Hematology  (+) Blood dyscrasia (Hb 5.4), anemia ,   Anesthesia Other Findings   Reproductive/Obstetrics                             Anesthesia Physical  Anesthesia Plan  ASA: IV and emergent  Anesthesia Plan: MAC   Post-op Pain Management:    Induction:   PONV Risk Score and Plan: 0  Airway Management Planned: Natural Airway  Additional Equipment: Arterial line and Spinal Drain  Intra-op Plan:   Post-operative Plan:   Informed Consent: I have reviewed the patients History and Physical, chart, labs and discussed the procedure including the risks, benefits and alternatives for the proposed anesthesia with the patient or authorized representative who has indicated his/her understanding and acceptance.       Plan Discussed with: CRNA, Anesthesiologist and Surgeon  Anesthesia Plan Comments:         Anesthesia Quick Evaluation

## 2020-09-11 NOTE — Progress Notes (Signed)
STROKE TEAM PROGRESS NOTE   INTERVAL HISTORY Dr. Everardo All and Dr. Chestine Spore are at the bedside. Pt last night reported not moving LEs, considered spinal cord ischemia postop of aortic dissection endovascular repair. Put on lumbar drain and BP augmentation. This morning, her LEs have some movement with wiggling toes and 2/5 strength.   Around 10:30 am, pt was found to have foaming in the mouse, altered mental status, not following commands, agitation and combative, O2 sat down to 60% and hypotension with BP down to 60s. Glucose 68. She was on cleviprex at that time and has received morning BP po meds. Put her on O2 mask, and stopped cleviprex, put on levophed, BP up to 120s, also received D50 for hypoglycemia. Pt at this point, not moving LEs any more, abdomen still tight, however, she denies abdominal pain.   Given the change, code stroke called. On exam, pt not focal neuro deficit except known paraplegia. Her Cre still high, however, weighed in and out with Dr. Chestine Spore and Dr. Everardo All, decided to intubated pt and perform stat CT head non-con, CTA chest abdomen and pelvis. Will also recommend stat EEG after CT, and MRI spinal cord once stable.   Vitals:   2020-09-21 0715 21-Sep-2020 0730 2020/09/21 0747 2020-09-21 0816  BP:    134/69  Pulse: 64 61  65  Resp: (!) 28 17  (!) 29  Temp:   97.7 F (36.5 C) 98 F (36.7 C)  TempSrc:   Oral Oral  SpO2: 91% 91%  90%  Weight:      Height:       CBC:  Recent Labs  Lab 09/10/20 0515 2020-09-21 0440  WBC 11.6* 12.3*  HGB 12.7 12.7  HCT 34.5* 34.0*  MCV 83.5 84.0  PLT 176 PENDING   Basic Metabolic Panel:  Recent Labs  Lab 09/10/20 0210 09/10/20 0515  NA 135 136  K 3.5 3.8  CL 104 103  CO2 13* 14*  GLUCOSE 98 143*  BUN 101* 102*  CREATININE 8.29* 8.13*  CALCIUM 7.3* 8.0*  MG 2.0 2.8*   Lipid Panel: No results for input(s): CHOL, TRIG, HDL, CHOLHDL, VLDL, LDLCALC in the last 168 hours. HgbA1c: No results for input(s): HGBA1C in the last 168  hours. Urine Drug Screen: No results for input(s): LABOPIA, COCAINSCRNUR, LABBENZ, AMPHETMU, THCU, LABBARB in the last 168 hours.  Alcohol Level No results for input(s): ETH in the last 168 hours.  IMAGING past 24 hours No results found.  PHYSICAL EXAM  Temp:  [96 F (35.6 C)-98 F (36.7 C)] 98 F (36.7 C) (10/18 0816) Pulse Rate:  [42-67] 66 (10/18 1117) Resp:  [9-29] 22 (10/18 1117) BP: (110-147)/(55-75) 118/66 (10/18 1000) SpO2:  [90 %-100 %] 100 % (10/18 1117) Arterial Line BP: (112-158)/(55-80) 112/60 (10/18 1000) FiO2 (%):  [100 %] 100 % (10/18 1117) Weight:  [50.5 kg] 50.5 kg (10/18 0500)  General - Well nourished, well developed, agitated, combative.  Ophthalmologic - fundi not visualized due to noncooperation.  Cardiovascular - Regular rhythm and rate.  Neuro - awake, eyes open, but agitated, combative with staff, actively moving b/l UEs. Able to answer questions with "yes" "no" "not really", however, not following simple commands, easily distractable and decreased concentration or attention. Eye moving in both horizontal directions, able to track bilaterally, not consistent blinking to visual threat bilaterally. Pupils 62mm initially, however, reactive to light slowly, consistent with tonic pupils, but equal round. No facial droop. Moving b/l UEs strong and symmetrical. No movement of BLEs,  no babinski, DTR diminished BLEs. Sensation, coordination not cooperative.    ASSESSMENT/PLAN Ms. Glenda Fuller is a 34 y.o. 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 S/p endovascular aorta repair.    AMS - ? Postictal confusion vs. ? Encephalopathy   Acute onset agitation combative in the setting with hypoxia, hypotension and hypoglycemia  Also reports of drooling, mouth foaming, "shaking vs. 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   Therapy recommendations:  pending  Disposition:  pending  Aortic dissection  Type B descending aortic dissection  S/p endovascular repair  Repeat CTA chest/abd/pelvis pending  Hypertensive emergency  Home meds:  none  Was on cleviprex and esmolol drip   Was on norvasc, coreg, hydralazine po  TTE suggested severe LVH  10/18 hypotensive episode  Now BP goal 140-150 - on levophed  AKI  Cre 10.12->8.29->8.13  On IVF  Anemia   Hb 5.4-> transfusion->10.2->12.7  Other Stroke Risk Factors  Hx ETOH use  Hx Substance abuse  Other Active Problems  mediastinal hematoma  Hospital day # 2  This patient is critically ill due to acute AMS, spinal cord infarct, aortic dissection s/p repair, hypertensive emergency, AKI and severe anemia and at significant risk of neurological worsening, death form seizure, status epilepticus, spinal cord infarct extension, aortic dissection worsening. This patient's care requires constant monitoring of vital signs, hemodynamics, respiratory and cardiac monitoring, review of multiple databases, neurological assessment, discussion with family, other specialists and medical decision making of high complexity. I spent 40 minutes of neurocritical care time in the care of this patient. I discussed with Dr. Haynes Kerns and Dr. Chestine Spore.  Marvel Plan, MD PhD Stroke Neurology 2020/09/17 11:53 AM   To contact Stroke Continuity provider, please refer to [email or web address]. After hours, contact General Neurology

## 2020-09-11 NOTE — Consult Note (Signed)
Neurology Consultation Reason for Consult: Lower extremity weakness Referring Physician: Sherald Hess  CC: Lower extremity weakness  History is obtained from: Patient  HPI: Glenda Fuller is a 34 y.o. female who presented to urgent care 2 days ago with back pain that have been going on for 2 weeks was found to have low hemoglobin, and when referred to the emergency department was found to have a dissection.  She underwent surgical repair yesterday and was moving her lower extremities after the procedure.  At some point, her legs became weak, though she is not sure exactly when because she has been in bed since then.  She now has lost pretty much all movement in her lower extremities.   LKW: 10/17 prior to surgery tpa given?: no, recent surgery   ROS: A 14 point ROS was performed and is negative except as noted in the HPI.  Past Medical History:  Diagnosis Date   Anemia    Hypertension      History reviewed. No pertinent family history.   Social History:  reports that she has never smoked. She does not have any smokeless tobacco history on file. She reports previous alcohol use. She reports previous drug use.   Exam: Current vital signs: BP 116/63    Pulse 67    Temp (!) 96 F (35.6 C) (Axillary)    Resp 19    Ht 5' (1.524 m)    Wt 44.9 kg    LMP 07/30/2020 (Approximate)    SpO2 93%    BMI 19.33 kg/m  Vital signs in last 24 hours: Temp:  [96 F (35.6 C)-98.1 F (36.7 C)] 96 F (35.6 C) (10/18 0100) Pulse Rate:  [42-67] 67 (10/18 0330) Resp:  [9-22] 19 (10/18 0330) BP: (103-136)/(55-87) 116/63 (10/18 0300) SpO2:  [90 %-100 %] 93 % (10/18 0330) Arterial Line BP: (109-157)/(53-77) 156/76 (10/18 0330)   Physical Exam  Constitutional: Appears well-developed and well-nourished.  Psych: Affect appropriate to situation Eyes: No scleral injection HENT: No OP obstrucion MSK: no joint deformities.  Cardiovascular: Normal rate and regular rhythm.  Respiratory: Effort  normal, non-labored breathing GI: Soft.  No distension. There is no tenderness.  Skin: WDI  Neuro: Mental Status: Patient is awake, alert, oriented to person, place, month, year, and situation. Patient is able to give a clear and coherent history. No signs of aphasia or neglect Cranial Nerves: II: Visual Fields are full. Pupils are equal, round, and reactive to light.   III,IV, VI: EOMI without ptosis or diploplia.  V: Facial sensation is symmetric to temperature VII: Facial movement is symmetric.  VIII: hearing is intact to voice X: Uvula elevates symmetrically XI: Shoulder shrug is symmetric. XII: tongue is midline without atrophy or fasciculations.  Motor: Tone is normal. Bulk is normal. 5/5 strength was present in bilateral upper extremities, she has minimal hip extension, no hip flexion, unable to wiggle toes or feet. Sensory: Sensation is intact to temperature, but diminished light touch in bilateral legs.  I am unable to tease out a clear spinal level. Deep Tendon Reflexes: 2+ and symmetric in the biceps and patellae.  Diminished in the right ankle, normal in the left ankle Plantars: Toes are downgoing bilaterally.  Cerebellar: Intact finger-nose-finger     I have reviewed labs in epic and the results pertinent to this consultation are: Creatinine 8.2  Impression: 34 year old female with acute paraparesis in the setting of aortic dissection.  This is almost certainly due to spinal vascular compromise either due to the  stent or two the dissection itself.  She has had a spinal drain placed in an attempt to improve spinal perfusion.  I do not know that antiplatelets would play a true role given the traumatic nature of the likely injury, but she is already on antiplatelet therapy with aspirin in any case.  Recommendations: 1) continue to optimize spinal perfusion as best as possible 2) PT, OT, ST 3) stroke team to follow   Ritta Slot, MD [facility] [phone]  If 7pm- 7am, please page neurology on call as listed in AMION.

## 2020-09-11 NOTE — Code Documentation (Signed)
Stroke Response Nurse Documentation Code Documentation  Glenda Fuller is a 34 y.o. female admitted to [facility] ED on 10/16 due to aortic dissection. Pt has hx of uncontrolled HTN and came to the [facility] ED with complaints of back pain x two weeks and low hemoglobin when she was evaluated at [facility]. Patient was transferred to [facility] and sent for emergency surgery once aortic dissection discovered.    Admitted to 2H. Pt stabilized and was at baseline during stay. This morning at 1040 while her family member was visiting, the patient was noted to have an episode of unresponsiveness and unable to speak correctly. Code stroke was activated.  Stroke Team arrived at the bedside. Patient intubated due to being unable to maintain airway. Transported to CT. CT Head completed and patient transported back up to 2H. Pt is not a candidate for tPA due to known dissection. Stroke not suspected by MD Roda Shutters. Plan: pt to have EEG completed. Handoff given to Arlys John, Charity fundraiser.    Lucila Maine  Stroke Response RN

## 2020-09-11 NOTE — Progress Notes (Signed)
vLTM started  Neurology notified  Event button tested   Same leads used for baseline EEG

## 2020-09-11 NOTE — Anesthesia Preprocedure Evaluation (Signed)
Anesthesia Evaluation  Patient identified by MRN, date of birth, ID band Patient unresponsive    Reviewed: Patient's Chart, lab work & pertinent test resultsPreop documentation limited or incomplete due to emergent nature of procedure.  Airway Mallampati: Intubated       Dental   Pulmonary    breath sounds clear to auscultation       Cardiovascular hypertension,  Rhythm:Regular Rate:Normal     Neuro/Psych    GI/Hepatic   Endo/Other    Renal/GU      Musculoskeletal   Abdominal   Peds  Hematology   Anesthesia Other Findings   Reproductive/Obstetrics                             Anesthesia Physical Anesthesia Plan  ASA: IV and emergent  Anesthesia Plan: General   Post-op Pain Management:    Induction: Intravenous  PONV Risk Score and Plan:   Airway Management Planned: Oral ETT  Additional Equipment: Arterial line and CVP  Intra-op Plan:   Post-operative Plan: Post-operative intubation/ventilation  Informed Consent: I have reviewed the patients History and Physical, chart, labs and discussed the procedure including the risks, benefits and alternatives for the proposed anesthesia with the patient or authorized representative who has indicated his/her understanding and acceptance.       Plan Discussed with: CRNA and Anesthesiologist  Anesthesia Plan Comments:         Anesthesia Quick Evaluation

## 2020-09-11 NOTE — Progress Notes (Signed)
   08/31/2020 1049  Clinical Encounter Type  Visited With Family  Visit Type Spiritual support  Referral From Nurse  Consult/Referral To Chaplain  Chaplain responded. Patient's fiance Jill Alexanders and stepsister at bedside. Offered comfort and prayer.This note was prepared by Deneen Harts, M.Div..  For questions please contact by phone [phone].

## 2020-09-11 NOTE — Progress Notes (Addendum)
Patient ID: Glenda Fuller, female   DOB: Jun 13, 1986, 34 y.o.   MRN: [id number]  S: Pt awake, alert, has had some return of lower extremity function  O:  Vitals:   09/02/2020 0900 09/23/2020 1000  BP: 126/66 118/66  Pulse: 61 61  Resp: (!) 25 (!) 25  Temp:    SpO2: 91% 90%   Spinal drain with free flow of clear CSF Dressing clean/dry/intact  A: Type B thoracic aortic dissection with contained rupture and loss of lower extremity function, s/p spinal drain placement on 09/12/2020 at request of vascular surgery  P: Continue spinal drain. Will remove at surgeon request.  Glenda Kern, MD 09/24/2020 10:56 AM

## 2020-09-11 NOTE — Progress Notes (Signed)
vLTM EEG on hold. Patient possibly going to the OR. RN will call back when he receives more info.

## 2020-09-11 NOTE — Progress Notes (Addendum)
PCCM Family Communication  Met with multiple family members including sister, sister's husband, pts partner Discussed acute change this morning-- at this time unclear cause of neuro change. Possible seizure. Does not look like CVA on CT H.   Discussed need for intubation and maintaining this intervention at this time. Discussed emergent need for CTA c/a/p to evaluate for any possible changes (not read at time of my conversation) Discussed likelihood of worse renal failure and probability that pt will need dialysis.  Later joined by Dr. Carlis Abbott at bedside after arrival of pt's second sister-- he has reviewed CTA c/a/p. Dr. Carlis Abbott explains the extent and complexity of th pts dissection and that based on CTA, recommends return to OR if aggressive care desired. He additionally explains possibility that present illness may not be survivable as well as likelihood that pt may not regain lower extremity function, and very likely will need dialysis.   Family wishes for return to OR.   All questions answered.  Eliseo Gum MSN, AGACNP-BC [place] [phone] If no answer, [phone] 09/15/2020, 1:17 PM

## 2020-09-11 NOTE — Progress Notes (Signed)
eLink Physician-Brief Progress Note Patient Name: Fani Rotondo DOB: [birth year] MRN: [id number]   Date of Service  09/10/2020  HPI/Events of Note  TH 1766. Was unable to calculate earlier. - continue Insuline gtt, on D10 at 50, low sugar.  Hg improving to 11.6, spinal site oozing. Like before.   eICU Interventions  -continue watching - asked RN to notify anesthesiology once also. - Ok to go up on D10 for hypoglycemia.      Intervention Category Intermediate Interventions: Other:;Bleeding - evaluation and treatment with blood products  Ranee Gosselin 09/06/2020, 11:30 PM

## 2020-09-11 NOTE — Procedures (Signed)
Intubation Procedure Note  Glenda Fuller  [id number]  1985/12/26  Date:09/18/2020  Time:11:15 AM   Provider Performing:Loula Marcella Mechele Collin    Procedure: Intubation (31500)  Indication(s) Respiratory Failure  Consent Risks of the procedure as well as the alternatives and risks of each were explained to the patient and/or caregiver.  Consent for the procedure was obtained and is signed in the bedside chart   Anesthesia Etomidate, Versed, Fentanyl and Rocuronium   Time Out Verified patient identification, verified procedure, site/side was marked, verified correct patient position, special equipment/implants available, medications/allergies/relevant history reviewed, required imaging and test results available.   Sterile Technique Usual hand hygeine, masks, and gloves were used   Procedure Description Patient positioned in bed supine.  Sedation given as noted above.  Patient was intubated with endotracheal tube using Glidescope.  View was Grade 1 full glottis .  Number of attempts was 1.  Colorimetric CO2 detector was consistent with tracheal placement.   Complications/Tolerance None; patient tolerated the procedure well. Chest X-ray is ordered to verify placement.   EBL Minimal   Specimen(s) None  Mechele Collin, M.D. [facility] Pulmonary/Critical Care Medicine 18-Sep-2020 11:16 AM

## 2020-09-11 NOTE — Progress Notes (Addendum)
Dr. Chestine Spore came to bedside and assessed patient.  Patient unable to move feet nor legs bilaterally at this time.  Patient is able to feel touch to both lower extremities but with decreased sensation to left lower extremity. +2 palpable pedal pulses bilaterally. Patient complains of heaviness in her legs, specifically her left leg. Non-pitting edema noted to BLE. Dr. Chestine Spore gave order to change blood pressure parameters to keep systolic blood pressure less than 140.  Esmolol drip stopped at this time to allow blood pressure to rise.

## 2020-09-11 NOTE — Progress Notes (Signed)
°   09/20/2020 1049  Clinical Encounter Type  Visited With Family  Visit Type Spiritual support  Referral From Nurse  Consult/Referral To Chaplain  Unit Secretary, Doris requested Chaplain to visit the patient's sister. Chaplain visited sister in waiting area along with social workers. Patient's sister's name is Randa Evens. Randa Evens stated she lost it and panicked when the patient had a seizure and foaming at the mouth. She is afraid they will lose her. She said their family has experienced several deaths. Stated patient has nine-year-old son who is autistic. Stated he is having a hard time emotionally because of patient in hospital. Patient is the oldest of three sisters.  Chaplain had prayer with sister and offered comfort. Patient's father and brother also came to support. This note was prepared by Deneen Harts, M.Div..  For questions please contact by phone [phone].

## 2020-09-11 NOTE — Anesthesia Procedure Notes (Signed)
Central Venous Catheter Insertion Performed by: Kipp Brood, MD, anesthesiologist Start/End10/06/2020 3:15 PM, 09/11/2020 3:20 PM Patient location: Pre-op. Preanesthetic checklist: patient identified, IV checked, site marked, risks and benefits discussed, surgical consent, monitors and equipment checked, pre-op evaluation, timeout performed and anesthesia consent Lidocaine 1% used for infiltration and patient sedated Hand hygiene performed  and maximum sterile barriers used  Catheter size: 8 Fr Total catheter length 16. Central line was placed.Double lumen Procedure performed using ultrasound guided technique. Ultrasound Notes:image(s) printed for medical record Attempts: 1 Following insertion, dressing applied and line sutured. Post procedure assessment: blood return through all ports  Patient tolerated the procedure well with no immediate complications.

## 2020-09-11 NOTE — Consult Note (Signed)
[facility]  INPATIENT CONSULTATION  Reason for Consultation: AKI Requesting Provider: Dr. Everardo All  HPI: Glenda Fuller is an 34 y.o. 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   Pt presented to urgent care > [facility] 8/16 with several week h/o back pain on background of untreated HTN (initial BP > 200) ultimately diagnosed with type B aortic dissection with mediastinal hematoma undergoing emergent operative intervention with endovascular stent graft placement of T aortic.  Did have arteriogram during initial OR with little perfusion of kidneys noted; initial Cr 10, in 8-9s after resusucitation.  Initial Hb in the 5s requiring transfusion.   Today developed neurologic issues - paraparesis requiring spinal drain placement, concern for seizures requiring intubation, hypotension requiring pressor support.  CTA with possible rupture of stent graft and returned to OR this afternoon.  Stent graft was extended.   Decompressive laparatomy with wound vac placement.  No rupture noted.    She's been essentially anuric over the admission and nephrology is consulted for support with CRRT.  I/Os +8L for the admission  PMH: Past Medical History:  Diagnosis Date  . Anemia   . Hypertension    PSH: Past Surgical History:  Procedure Laterality Date  . THORACIC AORTIC ENDOVASCULAR STENT GRAFT N/A 09/01/2020   Procedure: Ultrasound Guided Right Common Femoral Artery Access.  Intravascular Ultrasound of Right Iliac Artery, Abdominal Aorta, Thoracic Aorta, Ascending and Descending  Aorta.  Right Iliac Arteriogram with Right Femoral Artery Perclose.  Insertion Thoracic Endovascular Stent Graft;  Surgeon: Cephus Shelling, MD;  Location: [facility] OR;  Service: Vascular;  Laterality: N/A;    Past Medical History:  Diagnosis Date  . Anemia   . Hypertension     Medications:  I have reviewed the patient's current medications.  No  medications prior to admission.    ALLERGIES:  No Known Allergies  FAM HX: History reviewed. No pertinent family history.  Social History:   reports that she has never smoked. She does not have any smokeless tobacco history on file. She reports previous alcohol use. She reports previous drug use.  ROS: unable to obtain from intubated and sedated patient  Blood pressure 101/62, pulse (!) 58, temperature 97.8 F (36.6 C), temperature source Oral, resp. rate (!) 26, height 5' (1.524 m), weight 50.5 kg, last menstrual period 07/30/2020, SpO2 100 %. PHYSICAL EXAM: Gen: thin woman intubated and sedated  Eyes: anicteric ENT: OG in place draining gastric fluids CV:  RRR, no rub Abd: midline wound vac in place in lower half of abd Lungs: coarse vent BS GU: foley with scant yellow urine Extr: 1+ diffuse edema Neuro: sedated Skin: warm and dry   Results for orders placed or performed during the hospital encounter of 08/25/2020 (from the past 48 hour(s))  Prepare RBC (crossmatch)     Status: None   Collection Time: 09/01/2020  9:29 PM  Result Value Ref Range   Order Confirmation      ORDER PROCESSED BY BLOOD BANK Performed at [facility], [address] [place], [place] [address]   ABO/Rh     Status: None   Collection Time: 09/07/2020  9:34 PM  Result Value Ref Range   ABO/RH(D)      B POS Performed at [facility], [address], [place], [place] [address]   Type and screen     Status: None (Preliminary result)   Collection Time: 09/22/2020  9:39 PM  Result Value Ref Range   ABO/RH(D) B POS    Antibody Screen NEG    Sample Expiration      09/12/2020,2359 Performed at [facility], [address], [place], [place] [address]    Unit Number [id number]    Blood Component Type RBC LR PHER2    Unit division 00    Status of Unit ISSUED,FINAL    Unit tag comment EMERGENCY RELEASE    Transfusion Status OK TO TRANSFUSE    Crossmatch Result COMPATIBLE    Unit  Number [id number]    Blood Component Type RBC LR PHER1    Unit division 00    Status of Unit ISSUED,FINAL    Unit tag comment EMERGENCY RELEASE    Transfusion Status OK TO TRANSFUSE    Crossmatch Result COMPATIBLE    Unit Number [id number]    Blood Component Type RED CELLS,LR    Unit division 00    Status of Unit REL FROM [facility]    Unit tag comment VERBAL ORDERS PER DR LOCKWOOD    Transfusion Status OK TO TRANSFUSE    Crossmatch Result COMPATIBLE    Unit Number [id number]    Blood Component Type RED CELLS,LR    Unit division 00    Status of Unit REL FROM [facility]    Unit tag comment VERBAL ORDERS PER DR LOCKWOOD    Transfusion Status OK TO TRANSFUSE    Crossmatch Result COMPATIBLE    Unit Number [id number]    Blood Component Type RED CELLS,LR    Unit division 00    Status of Unit ISSUED,FINAL    Unit tag comment VERBAL ORDERS PER DR LOCKWOOD    Transfusion Status OK TO TRANSFUSE    Crossmatch Result COMPATIBLE    Unit Number [id number]    Blood Component Type RED CELLS,LR    Unit division 00    Status of Unit ISSUED,FINAL    Unit tag comment VERBAL ORDERS PER DR LOCKWOOD    Transfusion Status OK TO TRANSFUSE    Crossmatch Result COMPATIBLE    Unit Number [id number]    Blood Component Type RED CELLS,LR    Unit division 00    Status of Unit REL FROM [facility]    Transfusion Status OK TO TRANSFUSE    Crossmatch Result Compatible    Unit Number [id number]    Blood Component Type RED CELLS,LR    Unit division 00    Status of Unit ISSUED    Transfusion Status OK TO TRANSFUSE    Crossmatch Result Compatible    Unit Number [id number]    Blood Component Type RED CELLS,LR    Unit division 00    Status of Unit REL FROM [facility]    Transfusion Status OK TO TRANSFUSE    Crossmatch Result Compatible    Unit Number [id number]    Blood Component Type RED CELLS,LR    Unit division 00    Status of Unit ISSUED    Transfusion Status OK TO TRANSFUSE    Crossmatch  Result Compatible    Unit Number [id number]    Blood Component Type RED CELLS,LR    Unit division 00    Status of Unit ALLOCATED    Transfusion Status OK TO TRANSFUSE    Crossmatch Result Compatible    Unit Number [id number]    Blood Component Type RED CELLS,LR    Unit division 00    Status of Unit ALLOCATED    Transfusion Status OK TO TRANSFUSE  Crossmatch Result Compatible   Prepare fresh frozen plasma     Status: None   Collection Time: 09/20/2020 10:09 PM  Result Value Ref Range   Unit Number [id number]    Blood Component Type THW PLS APHR    Unit division A0    Status of Unit REL FROM [facility]    Transfusion Status OK TO TRANSFUSE    Unit Number [id number]    Blood Component Type THAWED PLASMA    Unit division 00    Status of Unit REL FROM [facility]    Transfusion Status OK TO TRANSFUSE    Unit Number [id number]    Blood Component Type THW PLS APHR    Unit division B0    Status of Unit REL FROM [facility]    Transfusion Status OK TO TRANSFUSE    Unit Number [id number]    Blood Component Type THAWED PLASMA    Unit division 00    Status of Unit REL FROM [facility]    Transfusion Status OK TO TRANSFUSE    Unit Number [id number]    Blood Component Type THAWED PLASMA    Unit division 00    Status of Unit REL FROM [facility]    Transfusion Status OK TO TRANSFUSE    Unit Number [id number]    Blood Component Type THW PLS APHR    Unit division B0    Status of Unit REL FROM [facility]    Transfusion Status OK TO TRANSFUSE    Unit Number [id number]    Blood Component Type THW PLS APHR    Unit division B0    Status of Unit REL FROM [facility]    Transfusion Status OK TO TRANSFUSE    Unit Number [id number]    Blood Component Type THW PLS APHR    Unit division B0    Status of Unit REL FROM [facility]    Transfusion Status OK TO TRANSFUSE    Unit Number [id number]    Blood Component Type THW PLS APHR    Unit division 00    Status of Unit REL FROM [facility]    Transfusion  Status      OK TO TRANSFUSE Performed at [facility], [address] [place], [place] [address]    Unit Number [id number]    Blood Component Type THW PLS APHR    Unit division 00    Status of Unit REL FROM [facility]    Transfusion Status OK TO TRANSFUSE    Unit Number [id number]    Blood Component Type THW PLS APHR    Unit division B0    Status of Unit REL FROM [facility]    Transfusion Status OK TO TRANSFUSE    Unit Number [id number]    Blood Component Type THW PLS APHR    Unit division A0    Status of Unit REL FROM [facility]    Transfusion Status OK TO TRANSFUSE   I-STAT 7, (LYTES, BLD GAS, ICA, H+H)     Status: Abnormal   Collection Time: 09/04/2020 11:01 PM  Result Value Ref Range   pH, Arterial 7.391 7.35 - 7.45   pCO2 arterial 25.6 (L) 32 - 48 mmHg   pO2, Arterial 255 (H) 83 - 108 mmHg   Bicarbonate 15.5 (L) 20.0 - 28.0 mmol/L   TCO2 16 (L) 22 - 32 mmol/L   O2 Saturation 100.0 %   Acid-base deficit 8.0 (H) 0.0 - 2.0 mmol/L   Sodium 135 135 - 145 mmol/L   Potassium 4.3 3.5 -  5.1 mmol/L   Calcium, Ion 0.87 (LL) 1.15 - 1.40 mmol/L   HCT 30.0 (L) 36 - 46 %   Hemoglobin 10.2 (L) 12.0 - 15.0 g/dL   Sample type ARTERIAL    Comment NOTIFIED PHYSICIAN   I-STAT 7, (LYTES, BLD GAS, ICA, H+H)     Status: Abnormal   Collection Time: 09/14/2020 11:39 PM  Result Value Ref Range   pH, Arterial 7.399 7.35 - 7.45   pCO2 arterial 24.9 (L) 32 - 48 mmHg   pO2, Arterial 261 (H) 83 - 108 mmHg   Bicarbonate 15.4 (L) 20.0 - 28.0 mmol/L   TCO2 16 (L) 22 - 32 mmol/L   O2 Saturation 100.0 %   Acid-base deficit 8.0 (H) 0.0 - 2.0 mmol/L   Sodium 135 135 - 145 mmol/L   Potassium 3.6 3.5 - 5.1 mmol/L   Calcium, Ion 1.00 (L) 1.15 - 1.40 mmol/L   HCT 30.0 (L) 36 - 46 %   Hemoglobin 10.2 (L) 12.0 - 15.0 g/dL   Sample type ARTERIAL   I-STAT 7, (LYTES, BLD GAS, ICA, H+H)     Status: Abnormal   Collection Time: 09/10/20 12:28 AM  Result Value Ref Range   pH, Arterial 7.397 7.35 - 7.45   pCO2  arterial 22.7 (L) 32 - 48 mmHg   pO2, Arterial 266 (H) 83 - 108 mmHg   Bicarbonate 14.0 (L) 20.0 - 28.0 mmol/L   TCO2 15 (L) 22 - 32 mmol/L   O2 Saturation 100.0 %   Acid-base deficit 9.0 (H) 0.0 - 2.0 mmol/L   Sodium 135 135 - 145 mmol/L   Potassium 3.5 3.5 - 5.1 mmol/L   Calcium, Ion 1.00 (L) 1.15 - 1.40 mmol/L   HCT 29.0 (L) 36 - 46 %   Hemoglobin 9.9 (L) 12.0 - 15.0 g/dL   Sample type ARTERIAL   CBC     Status: Abnormal   Collection Time: 09/10/20  2:10 AM  Result Value Ref Range   WBC 13.9 (H) 4.0 - 10.5 K/uL   RBC 4.11 3.87 - 5.11 MIL/uL   Hemoglobin 11.5 (L) 12.0 - 15.0 g/dL   HCT 16.1 (L) 36 - 46 %   MCV 84.7 80.0 - 100.0 fL   MCH 28.0 26.0 - 34.0 pg   MCHC 33.0 30.0 - 36.0 g/dL   RDW 09.6 04.5 - 40.9 %   Platelets 161 150 - 400 K/uL   nRBC 0.2 0.0 - 0.2 %    Comment: Performed at [facility], [address] [place], [place] [address]  Basic metabolic panel     Status: Abnormal   Collection Time: 09/10/20  2:10 AM  Result Value Ref Range   Sodium 135 135 - 145 mmol/L   Potassium 3.5 3.5 - 5.1 mmol/L   Chloride 104 98 - 111 mmol/L   CO2 13 (L) 22 - 32 mmol/L   Glucose, Bld 98 70 - 99 mg/dL    Comment: Glucose reference range applies only to samples taken after fasting for at least 8 hours.   BUN 101 (H) 6 - 20 mg/dL   Creatinine, Ser 4.78 (H) 0.44 - 1.00 mg/dL   Calcium 7.3 (L) 8.9 - 10.3 mg/dL   GFR, Estimated 6 (L) >60 mL/min   Anion gap 18 (H) 5 - 15    Comment: Performed at [facility], [address] [place], [place] [address]  Magnesium     Status: None   Collection Time: 09/10/20  2:10 AM  Result Value Ref Range   Magnesium 2.0 1.7 - 2.4 mg/dL    Comment: Performed at [facility], [address] [place], [place] [address]  Protime-INR     Status: Abnormal   Collection Time: 09/10/20  2:10 AM  Result Value Ref Range   Prothrombin Time 15.8 (H) 11.4 - 15.2 seconds   INR 1.3 (H) 0.8 - 1.2    Comment: (NOTE) INR goal varies based  on device and disease states. Performed at [facility], [address] [place], [place] [address]   APTT     Status: Abnormal   Collection Time: 09/10/20  2:10 AM  Result Value Ref Range   aPTT 41 (H) 24 - 36 seconds    Comment:        IF BASELINE aPTT IS ELEVATED, SUGGEST PATIENT RISK ASSESSMENT BE USED TO DETERMINE APPROPRIATE ANTICOAGULANT THERAPY. Performed at [facility], [address] [place], [place] [address]   MRSA PCR Screening     Status: None   Collection Time: 09/10/20  3:09 AM   Specimen: Nasopharyngeal  Result Value Ref Range   MRSA by PCR NEGATIVE NEGATIVE    Comment:        The GeneXpert MRSA Assay (FDA approved for NASAL specimens only), is one component of a comprehensive MRSA colonization surveillance program. It is not intended to diagnose MRSA infection nor to guide or monitor treatment for MRSA infections. Performed at [facility], [address] [place], [place] [address]   Basic metabolic panel     Status: Abnormal   Collection Time: 09/10/20  5:15 AM  Result Value Ref Range   Sodium 136 135 - 145 mmol/L   Potassium 3.8 3.5 - 5.1 mmol/L   Chloride 103 98 - 111 mmol/L   CO2 14 (L) 22 - 32 mmol/L   Glucose, Bld 143 (H) 70 - 99 mg/dL    Comment: Glucose reference range applies only to samples taken after fasting for at least 8 hours.   BUN 102 (H) 6 - 20 mg/dL   Creatinine, Ser 6.578.13 (H) 0.44 - 1.00 mg/dL   Calcium 8.0 (L) 8.9 - 10.3 mg/dL   GFR, Estimated 6 (L) >60 mL/min   Anion gap 19 (H) 5 - 15    Comment: Performed at [facility], [address] [place], [place] [address]  Magnesium     Status: Abnormal   Collection Time: 09/10/20  5:15 AM  Result Value Ref Range   Magnesium 2.8 (H) 1.7 - 2.4 mg/dL    Comment: Performed at [facility], [address] [place], [place] [address]  CBC     Status: Abnormal   Collection Time: 09/10/20  5:15 AM  Result Value Ref Range   WBC 11.6 (H) 4.0 - 10.5 K/uL    RBC 4.13 3.87 - 5.11 MIL/uL   Hemoglobin 12.7 12.0 - 15.0 g/dL   HCT 41.334.5 (L) 36 - 46 %   MCV 83.5 80.0 - 100.0 fL   MCH 30.8 26.0 - 34.0 pg   MCHC 36.8 (H) 30.0 - 36.0 g/dL   RDW 24.415.8 (H) 01.011.5 - 27.215.5 %   Platelets 176 150 - 400 K/uL   nRBC 0.3 (H) 0.0 - 0.2 %    Comment: Performed at [facility], [address] [place], [place] [address]  HIV Antibody (routine testing w rflx)     Status: None  Collection Time: 09/10/20 11:37 AM  Result Value Ref Range   HIV Screen 4th Generation wRfx Non Reactive Non Reactive    Comment: Performed at [facility], [address] [place], [place] [address]  CBC     Status: Abnormal   Collection Time: 09/14/2020  4:40 AM  Result Value Ref Range   WBC 12.3 (H) 4.0 - 10.5 K/uL   RBC 4.05 3.87 - 5.11 MIL/uL   Hemoglobin 12.7 12.0 - 15.0 g/dL    Comment: CORRECTED FOR LIPEMIA   HCT 34.0 (L) 36 - 46 %   MCV 84.0 80.0 - 100.0 fL   MCH 31.4 26.0 - 34.0 pg    Comment: CORRECTED FOR LIPEMIA   MCHC 37.4 (H) 30.0 - 36.0 g/dL    Comment: CORRECTED FOR LIPEMIA   RDW 17.0 (H) 11.5 - 15.5 %   Platelets 233 150 - 400 K/uL    Comment: REPEATED TO VERIFY PLATELET COUNT CONFIRMED BY SMEAR    nRBC 2.1 (H) 0.0 - 0.2 %    Comment: Performed at [facility], [address], [place], [place] [address]  Comprehensive metabolic panel     Status: Abnormal   Collection Time: 09/15/2020  4:40 AM  Result Value Ref Range   Sodium 129 (L) 135 - 145 mmol/L    Comment: ELECTROLYTES REPEATED.[facility] POST-ULTRACENTRIFUGATION    Potassium 3.8 3.5 - 5.1 mmol/L    Comment: POST-ULTRACENTRIFUGATION   Chloride 97 (L) 98 - 111 mmol/L    Comment: POST-ULTRACENTRIFUGATION   CO2 9 (L) 22 - 32 mmol/L    Comment: POST-ULTRACENTRIFUGATION   Glucose, Bld 71 70 - 99 mg/dL    Comment: Glucose reference range applies only to samples taken after fasting for at least 8 hours. POST-ULTRACENTRIFUGATION    BUN 94 (H) 6 - 20 mg/dL   Creatinine, Ser 4.78 (H) 0.44 - 1.00  mg/dL   Calcium 8.0 (L) 8.9 - 10.3 mg/dL    Comment: POST-ULTRACENTRIFUGATION   Total Protein 5.3 (L) 6.5 - 8.1 g/dL    Comment: POST-ULTRACENTRIFUGATION   Albumin 2.6 (L) 3.5 - 5.0 g/dL    Comment: POST-ULTRACENTRIFUGATION   AST 63 (H) 15 - 41 U/L    Comment: POST-ULTRACENTRIFUGATION   ALT 35 0 - 44 U/L    Comment: POST-ULTRACENTRIFUGATION   Alkaline Phosphatase 114 38 - 126 U/L   Total Bilirubin 1.2 0.3 - 1.2 mg/dL    Comment: POST-ULTRACENTRIFUGATION   GFR, Estimated 6 (L) >60 mL/min   Anion gap 23 (H) 5 - 15    Comment: Performed at [facility], [address] [place], [place] [address]  Triglycerides     Status: Abnormal   Collection Time: 09/12/2020  4:40 AM  Result Value Ref Range   Triglycerides 4,343 (H) <150 mg/dL    Comment: RESULTS CONFIRMED BY MANUAL DILUTION Performed at [facility], [address] [place], [place] [address]   Lipid panel     Status: Abnormal   Collection Time: 09/08/2020  7:08 AM  Result Value Ref Range   Cholesterol 102 0 - 200 mg/dL   Triglycerides 5,784 (H) <150 mg/dL    Comment: RESULTS CONFIRMED BY MANUAL DILUTION   HDL NOT REPORTED DUE TO HIGH TRIGLYCERIDES >40 mg/dL   Total CHOL/HDL Ratio NOT REPORTED DUE TO HIGH TRIGLYCERIDES RATIO   VLDL UNABLE TO CALCULATE IF TRIGLYCERIDE OVER 400 mg/dL 0 - 40 mg/dL   LDL Cholesterol UNABLE TO CALCULATE IF TRIGLYCERIDE OVER 400 mg/dL 0 -  99 mg/dL    Comment: Performed at [facility], [address] [place], [place] [address]  LDL cholesterol, direct     Status: None   Collection Time: 09/12/2020  7:08 AM  Result Value Ref Range   Direct LDL 37.9 0 - 99 mg/dL    Comment: Performed at [facility], [address], [place], [place] [address]  I-STAT 7, (LYTES, BLD GAS, ICA, H+H)     Status: Abnormal   Collection Time: 09/21/2020 10:51 AM  Result Value Ref Range   pH, Arterial 7.205 (L) 7.35 - 7.45   pCO2 arterial 37.1 32 - 48 mmHg   pO2, Arterial 80 (L) 83 - 108 mmHg    Bicarbonate 14.7 (L) 20.0 - 28.0 mmol/L   TCO2 16 (L) 22 - 32 mmol/L   O2 Saturation 93.0 %   Acid-base deficit 12.0 (H) 0.0 - 2.0 mmol/L   Sodium 130 (L) 135 - 145 mmol/L   Potassium 3.3 (L) 3.5 - 5.1 mmol/L   Calcium, Ion 1.05 (L) 1.15 - 1.40 mmol/L   HCT 35.0 (L) 36 - 46 %   Hemoglobin 11.9 (L) 12.0 - 15.0 g/dL   Patient temperature 35.0 F    Sample type ARTERIAL   Glucose, capillary     Status: Abnormal   Collection Time: 09/23/2020 10:54 AM  Result Value Ref Range   Glucose-Capillary 68 (L) 70 - 99 mg/dL    Comment: Glucose reference range applies only to samples taken after fasting for at least 8 hours.  I-STAT, chem 8     Status: Abnormal   Collection Time: 09/18/2020 10:56 AM  Result Value Ref Range   Sodium 129 (L) 135 - 145 mmol/L   Potassium 3.3 (L) 3.5 - 5.1 mmol/L   Chloride 100 98 - 111 mmol/L   BUN 103 (H) 6 - 20 mg/dL   Creatinine, Ser 0.93 (H) 0.44 - 1.00 mg/dL   Glucose, Bld 68 (L) 70 - 99 mg/dL    Comment: Glucose reference range applies only to samples taken after fasting for at least 8 hours.   Calcium, Ion 1.09 (L) 1.15 - 1.40 mmol/L   TCO2 15 (L) 22 - 32 mmol/L   Hemoglobin 11.6 (L) 12.0 - 15.0 g/dL   HCT 81.8 (L) 36 - 46 %  I-STAT 7, (LYTES, BLD GAS, ICA, H+H)     Status: Abnormal   Collection Time: 09/06/2020 12:17 PM  Result Value Ref Range   pH, Arterial 7.227 (L) 7.35 - 7.45   pCO2 arterial 36.5 32 - 48 mmHg   pO2, Arterial 266 (H) 83 - 108 mmHg   Bicarbonate 15.2 (L) 20.0 - 28.0 mmol/L   TCO2 16 (L) 22 - 32 mmol/L   O2 Saturation 100.0 %   Acid-base deficit 12.0 (H) 0.0 - 2.0 mmol/L   Sodium 127 (L) 135 - 145 mmol/L   Potassium 2.7 (LL) 3.5 - 5.1 mmol/L   Calcium, Ion 0.99 (L) 1.15 - 1.40 mmol/L   HCT 30.0 (L) 36 - 46 %   Hemoglobin 10.2 (L) 12.0 - 15.0 g/dL   Patient temperature 29.9 F    Collection site Web designer by RT    Sample type ARTERIAL    Comment NOTIFIED PHYSICIAN   Prepare RBC (crossmatch)     Status: None   Collection Time:  09/24/2020 12:41 PM  Result Value Ref Range   Order Confirmation      ORDER PROCESSED BY BLOOD  BANK Performed at [facility], [address], [place], [place] [address]   Glucose, capillary     Status: Abnormal   Collection Time: 09/23/2020 12:45 PM  Result Value Ref Range   Glucose-Capillary 137 (H) 70 - 99 mg/dL    Comment: Glucose reference range applies only to samples taken after fasting for at least 8 hours.    DG CHEST PORT 1 VIEW  Result Date: 09/17/2020 CLINICAL DATA:  Status post repositioning of endotracheal tube. EXAM: PORTABLE CHEST 1 VIEW COMPARISON:  Single-view of the chest 09/18/2020 at 11:13 a.m. FINDINGS: The patient's endotracheal tube has been pulled back and its tip is now approximately 1.5 cm above the carina. No other change. IMPRESSION: Endotracheal tube tip now projects 1.5 cm above the carina. No other change. Electronically Signed   By: Drusilla Kanner M.D.   On: 09/24/2020 11:35   DG CHEST PORT 1 VIEW  Result Date: 09/10/2020 CLINICAL DATA:  Status post intubation. EXAM: PORTABLE CHEST 1 VIEW COMPARISON:  Single-view of the chest 09/10/2020. FINDINGS: Endotracheal tube tip is in the right mainstem bronchus. Recommend withdrawal of 2-3 cm. Hazy opacity in the right lower chest is new since yesterday's examination could be due to atelectasis or pleural fluid. Left lung is clear. No pneumothorax. Aortic stent graft noted. IMPRESSION: ET tube tip is within right mainstem bronchus. Recommend withdrawal of 2-3 cm. New right basilar opacity could be due to atelectasis and/or effusion. Electronically Signed   By: Drusilla Kanner M.D.   On: 08/28/2020 11:34   DG Chest Port 1 View  Result Date: 09/10/2020 CLINICAL DATA:  Stent EXAM: PORTABLE CHEST 1 VIEW COMPARISON:  None. FINDINGS: There is an aortic stent graft now placed. There is no focal airspace consolidation. There is atelectasis at the left lung base. Mild cardiomegaly. IMPRESSION: Status post aortic stent  graft placement. No focal airspace disease. Electronically Signed   By: Deatra Robinson M.D.   On: 09/10/2020 02:52   DG Abd Portable 1V  Result Date: 09/21/2020 CLINICAL DATA:  NG tube placement EXAM: PORTABLE ABDOMEN - 1 VIEW COMPARISON:  None. FINDINGS: Enteric tube terminates within the stomach. Bowel gas pattern is unremarkable on this single view. A aortic stent graft is seen. IMPRESSION: Enteric tube terminates within the stomach. Electronically Signed   By: Guadlupe Spanish M.D.   On: 09/16/2020 13:22   CT Renal Stone Study  Result Date: September 22, 2020 CLINICAL DATA:  Acute renal failure with decreased hemoglobin. EXAM: CT ABDOMEN AND PELVIS WITHOUT CONTRAST TECHNIQUE: Multidetector CT imaging of the abdomen and pelvis was performed following the standard protocol without IV contrast. COMPARISON:  None. FINDINGS: Lower chest: Ill-defined high-density in the lower mediastinum. Small pleural effusions and subtle interlobular septal thickening at the bases. Hypertrophic appearance of the left ventricle. In the lower aorta there is a ring like high-density structures with rotating appearance and apparent tethering at the level of the celiac origin. This continues to the level of at least the aortic bifurcation. Hepatobiliary: No focal liver abnormality.No evidence of biliary obstruction or calcified stone. Pancreas: Unremarkable. Spleen: Unremarkable. Adrenals/Urinary Tract: Negative adrenals. Symmetric renal atrophy. 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 Unremarkable bladder. Stomach/Bowel:  No obstruction. No appendicitis. Vascular/Lymphatic: No acute vascular abnormality. No mass or adenopathy. Reproductive:Posterior intramural fibroid appearance measuring 2.5 cm Other: Nonspecific trace pelvic fluid. Musculoskeletal: No acute abnormalities. Critical Value/emergent results were called by telephone at the time of interpretation on 22-Sep-2020 at 6:55 pm to  provider  Harlene Salts , who verbally acknowledged these results. The patient's creatinine is 10. We discussed doing a point of care abdominal ultrasound to confirm flap, and obtaining an emergent chest CT. IMPRESSION: 1. Suspect aortic dissection originating in the chest, with a minimally covered mediastinal hematoma. Recommend emergent chest CT. 2. 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 3. Small bilateral pleural effusion and probable mild interstitial pulmonary edema. Electronically Signed   By: Marnee Spring M.D.   On: 09/02/2020 19:02   ECHO INTRAOPERATIVE TEE  Result Date: 09/10/2020  *INTRAOPERATIVE TRANSESOPHAGEAL REPORT *  Patient Name:   LOISE ESGUERRA Date of Exam: 09/20/2020 Medical Rec #:  [id number]      Height:       60.0 in Accession #:    [id number]     Weight:       86.1 lb Date of Birth:  11/25/1986      BSA:          1.30 m Patient Age:    34 years       BP:           118/56 mmHg Patient Gender: F              HR:           68 bpm. Exam Location:  Inpatient Transesophogeal exam was perform intraoperatively during surgical procedure. Patient was closely monitored under general anesthesia during the entirety of examination. Indications:     Aortic Dissection Performing Phys: [id number] CARS[address] JACKSON Diagnosing Phys: Jairo Ben MD Report CC'd to:  Clotilde Dieter Complications: No known complications during this procedure. PRE-OP FINDINGS  Left Ventricle: The left ventricle has hyperdynamic systolic function, with an ejection fraction of >65%. The cavity size was decreased. There is severely increased left ventricular wall thickness, measuring 2.0 cm. No evidence of left ventricular regional wall motion abnormalities. Right Ventricle: The right ventricle has normal systolic function. The cavity was small. There is increased right ventricular wall thickness. Left Atrium: Left atrial size was normal in size. The left atrial appendage is  well visualized and there is no evidence of thrombus present. Left atrial appendage velocity is normal at greater than 40 cm/s. Right Atrium: Right atrial size was normal in size. Interatrial Septum: No atrial level shunt detected by color flow Doppler. Pericardium: A small pericardial effusion is present. Mitral Valve: The mitral valve is normal in structure. No thickening of the mitral valve leaflets. No calcification of the mitral valve leaflets. Mitral valve regurgitation is trivial by color flow Doppler. There is no evidence of mitral valve vegetation. Pulmonary venous flow is normal. There is no evidence of mitral stenosis. Tricuspid Valve: The tricuspid valve was normal in structure. Tricuspid valve regurgitation is mild by color flow Doppler. The jet is directed toward the atrial septum. There is no evidence of tricuspid valve vegetation. Aortic Valve: The aortic valve is tricuspid, with normal leaflet excursion. Aortic valve regurgitation is trivial by color flow Doppler. There is no evidence of aortic valve stenosis. There is no evidence of a vegetation on the aortic valve. Pulmonic Valve: The pulmonic valve was normal in structure, with normal leaflet excursion. No evidence of pulmonic stenosis. Pulmonic valve regurgitation is trivial by color flow Doppler. Aorta: The ascending aorta and aortic arch appear normal in size and. There is a dissection of the entire descending aorta, extending from the arch and continuing distally beyond the limitation of TEE visualization. A false lumen can be  seen that contains thrombus. There is an interventional wire present in the true lumen of the aorta, extending into the Sinus of Valsalva. This is causing an artifactual appearance of continuation of the dissection into the ascending aorta, however with removal of  the wire, the ascending aorta and aortic root appear normal. Pulmonary Artery: The pulmonary artery is of normal size. Venous: The inferior vena cava was not  well visualized. +--------------+-------++ LEFT VENTRICLE        +--------------+-------++ PLAX 2D               +--------------+-------++ LVIDd:        2.00 cm +--------------+-------++                       +--------------+-------++  +-------------+-------++ AORTA                +-------------+-------++ Ao Root diam:3.00 cm +-------------+-------++  Jairo Ben MD Electronically signed by Jairo Ben MD Signature Date/Time: 09/10/2020/2:19:50 AM    Final    CT Angio Chest/Abd/Pel for Dissection W and/or W/WO  Result Date: 08/30/2020 CLINICAL DATA:  None none EXAM: CT ANGIOGRAPHY CHEST, ABDOMEN AND PELVIS TECHNIQUE: Non-contrast CT of the chest was initially obtained. Multidetector CT imaging through the chest, abdomen and pelvis was performed using the standard protocol during bolus administration of intravenous contrast. Multiplanar reconstructed images and MIPs were obtained and reviewed to evaluate the vascular anatomy. CONTRAST:  OMNIPAQUE IOHEXOL 350 MG/ML SOLN COMPARISON:  2020-09-30 FINDINGS: CTA CHEST FINDINGS Cardiovascular: Since the prior examination, stent graft repair of the type B aortic dissection has been performed with a endoluminal stent graft extending from the aortic arch just beyond the takeoff of the left subclavian artery to the diaphragmatic hiatus. The false lumen has thrombosed there is wide patency of the stented true lumen of the descending thoracic aorta. The ascending aorta is unremarkable. Conventional arch vessel anatomy with wide patency of the arch vasculature proximally. Mild cardiomegaly with marked left ventricular hypertrophy again noted. The coronary arteries are widely patent proximally the aortic valve is trileaflet. No pericardial effusion. Central pulmonary arteries are of normal caliber. Mediastinum/Nodes: No pathologic thoracic adenopathy. Thyroid unremarkable. Mediastinal hematoma again noted, grossly stable from prior  examination. Lungs/Pleura: Endotracheal tube is seen with its tip at the carina oriented toward the right mainstem bronchus. Large bilateral pleural effusions are present, enlarged since prior examination with associated complete collapse of the right lower lobe and near complete collapse of the left lower lobe. No pneumothorax. No central obstructing lesion. Musculoskeletal: Epidural catheter is seen with its tip at the level of T7-8. No acute bone abnormality. Review of the MIP images confirms the above findings. CTA ABDOMEN AND PELVIS FINDINGS VASCULAR Aorta: The aortic dissection continues in similar configuration of prior examination with the left gastric artery and right phrenic artery arising from the false lumen, the celiac axis, which is comprised of the common hepatic artery and splenic artery, arising from the true lumen, as well as the superior mesenteric artery, inferior mesenteric artery, and renal arteries bilaterally arising from the true lumen. There is an excrescence of contrast representing a continuation of the false lumen along the left lateral border of the juxtarenal aorta compatible with a contained leak. There is a tiny focus of hyperdensity seen external to the aorta at this level compatible with rupture and extraluminal hemorrhage. This is best seen on axial images 127-132/6. The dissection terminates within the infrarenal aorta with the common iliac arteries  arising from the true lumen. No aneurysm. There is wide patency of the mesenteric and renal vasculature. Celiac: See above. Variant anatomy. Left gastric artery arises from the false lumen. Common hepatic artery and splenic artery form the true celiac axis and arise from the true lumen and are widely patent. SMA: Arises from the true lumen and is widely patent. No evidence of distal embolization. Renals: Arise from the true lumen and are widely patent. IMA: Arise from the true lumen and is widely patent. Inflow: Arise from the true  lumen and are widely patent. Internal iliac arteries are patent bilaterally. Veins: Not well opacified. Review of the MIP images confirms the above findings. NON-VASCULAR Hepatobiliary: Vicarious excretion of contrast within the gallbladder lumen. Liver unremarkable. Pancreas: Not well opacified due to retroperitoneal hematoma and phase of contrast administration. Spleen: Nonenhancing though the splenic artery is patent distally suggesting that this relates to early phase of contrast enhancement. Adrenals/Urinary Tract: As noted previously, the kidneys are markedly atrophic bilaterally. Hyperdensity of the renal cortices represents residual contrast from prior examination and may simply represent physiologic retention though changes of contrast induced nephropathy could appear similarly. No hydronephrosis. The bladder is decompressed with a Foley catheter balloon seen within its lumen. Stomach/Bowel: There is moderate fluid within the abdomen which is uniformly hyperdense and is compatible with moderate hemoperitoneum. The mild ascites was present on prior examination, the increased density and increasing volume of fluid suggests that the hemoperitoneum represents an interval process. Bowel unremarkable. Lymphatic: The retroperitoneal lymph nodes are obscured by an increasing retroperitoneal hemorrhage which now obliterates the fat plane surrounding the abdominal aorta within the retroperitoneum. Reproductive: Unremarkable Other: Increasing diffuse subcutaneous edema is present in keeping with progressive anasarca Musculoskeletal: No acute bone abnormality Review of the MIP images confirms the above findings. IMPRESSION: Interval endovascular repair of a type B aortic dissection within the thorax with thrombosis of the false lumen and wide patency of the stented true lumen. Stable appearance of type B aortic dissection within the abdominal aorta. Evidence of rupture involving the left lateral aspect of the  juxtarenal abdominal aorta. Increasing retroperitoneal hemorrhage is present as is new moderate hemoperitoneum. Marked atrophy of the kidneys again noted. Superimposed cortical hyperdensity represents retained contrast and may be physiologic or the sequela of contrast induced nephropathy. Marked left ventricular hypertrophy again noted. Progressive anasarca now with large bilateral pleural effusions and resultant bibasilar compressive atelectasis and diffuse body wall subcutaneous edema. These results were called by telephone at the time of interpretation on 09/07/2020 at 12:42 pm to provider CHI ELLISON , who verbally acknowledged these results. Electronically Signed   By: Helyn Numbers MD   On: 09/12/2020 12:44   CT Angio Chest/Abd/Pel for Dissection W and/or Wo Contrast  Result Date: 22-Sep-2020 CLINICAL DATA:  Left-sided back pain EXAM: CT ANGIOGRAPHY CHEST, ABDOMEN AND PELVIS TECHNIQUE: Non-contrast CT of the chest was initially obtained. Multidetector CT imaging through the chest, abdomen and pelvis was performed using the standard protocol during bolus administration of intravenous contrast. Multiplanar reconstructed images and MIPs were obtained and reviewed to evaluate the vascular anatomy. CONTRAST:  OMNIPAQUE IOHEXOL 350 MG/ML SOLN COMPARISON:  None. FINDINGS: CTA CHEST FINDINGS Cardiovascular: Moderate hematoma extending from the isthmus of the aorta inferiorly to nearly the diaphragm. Marked left ventricular hypertrophy. No pericardial effusion. Aortic dissection beginning at the isthmus and continuing into the abdomen. The false lumen compresses the true lumen with equalized enhancement and size after a prominent fenestration near the hiatus. The  coronary arteries are enhancing, better seen on the left. Mediastinum/Nodes: Hematoma as noted above. Lungs/Pleura: Small pleural effusions and trace interlobular septal thickening at the bases. Musculoskeletal: No acute finding Review of the MIP  images confirms the above findings. CTA ABDOMEN AND PELVIS FINDINGS VASCULAR Aortic dissection with equalized enhancement of the true and false lumens. A wide fenestration is seen at the hiatus. The true lumen serves the celiac axis, SMA, and bilateral renal arteries. Dissection terminates at the level of the aortic bifurcation. No downstream emboli are seen in the major visceral branches. Review of the MIP images confirms the above findings. NON-VASCULAR Hepatobiliary: No focal liver abnormality.No evidence of biliary obstruction or stone. Pancreas: Unremarkable. Spleen: Unremarkable. Adrenals/Urinary Tract: Negative adrenals. No hydronephrosis or stone. Symmetric renal atrophy unremarkable bladder. Stomach/Bowel:  No evidence of bowel ischemia. Lymphatic: No mass or adenopathy. Reproductive:Fundal fibroid appearance measuring approximately 2 cm. Other: Trace pelvic fluid. Musculoskeletal: No acute abnormalities. Review of the MIP images confirms the above findings. Critical Value/emergent results were called by telephone at the time of interpretation on 09/24/2020 at 7:23 pm to provider DAN FLOYD , who verbally acknowledged these results. IMPRESSION: 1. 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 No ascending aortic involvement. 2. Notable compression of the true lumen until a large fenestration near the hiatus where there is also equalized enhancement of the lumens. The dissection terminates by the aortic bifurcation. 3. No downstream emboli or organ ischemia noted. 4. Marked left ventricular hypertrophy. 5. Symmetric renal atrophy. Electronically Signed   By: Marnee Spring M.D.   On: 09/21/2020 19:31   CT HEAD CODE STROKE WO CONTRAST  Result Date: 09/18/2020 CLINICAL DATA:  Code stroke.  Acute neuro deficit.  Aphasia. EXAM: CT HEAD WITHOUT CONTRAST TECHNIQUE: Contiguous axial images were obtained from the base of the skull through the  vertex without intravenous contrast. COMPARISON:  None. FINDINGS: Brain: Ventricle size normal.  No acute infarct or mass. There is increased density and thickening of the tentorium bilaterally. The falx also appears hyperdense and mildly thickened. No subarachnoid hemorrhage identified. Vascular: Vessels are diffusely hyperdense including arteries and veins. No asymmetric density of the circle-of-Willis. There is question hypodensity within the transverse and sigmoid sinus bilaterally raising the possibility of venous sinus thrombosis. Skull: Negative Sinuses/Orbits: Paranasal sinuses clear. Mastoid sinus clear. Negative orbit. Other: None ASPECTS (Alberta Stroke Program Early CT Score) - Ganglionic level infarction (caudate, lentiform nuclei, internal capsule, insula, M1-M3 cortex): 7 - Supraganglionic infarction (M4-M6 cortex): 3 Total score (0-10 with 10 being normal): 10 IMPRESSION: 1. Negative for acute infarct. 2. Diffuse increased density of the dural venous sinuses. There is also thickening and increased density of the tentorium bilaterally and the falx. The patient has a lumbar drain and low intracranial pressure could be the cause of venous congestion. Question hypodensity in the transverse and sigmoid sinus bilaterally which could represent venous sinus thrombosis and venous congestion of the falx and tentorium. 3. ASPECTS is 10 4. These results were called by telephone at the time of interpretation on 09/16/2020 at 12:00 pm to provider Roda Shutters , who verbally acknowledged these results. Electronically Signed   By: Marlan Palau M.D.   On: 09/06/2020 12:00   HYBRID OR IMAGING ([facility] ONLY)  Result Date: 09/14/2020 There is no interpretation for this exam.  This order is for images obtained during a surgical procedure.  Please See "Surgeries" Tab for more information regarding the procedure.   HYBRID OR IMAGING ([facility] ONLY)  Result Date: 09/08/2020 There is no interpretation for this exam.  This order is  for images obtained during a surgical procedure.  Please See "Surgeries" Tab for more information regarding the procedure.    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  Per vascular surgery.   **AKI: multifactorial with ischemic ATN from hypoperfusion and contrast.  Starting CRRT now - goal 50-16mL/hr net neg as pressures tolerate.   Discussed with PCCM I defer to them regarding addition of vasopressor support/BP goals with spinal drain in place as it relates to fluid removal rate.   Hold on heparin for now.  All 4K/2.5Ca fluids for now.    **Hypertension:  Per PCCM as above.  Certainly would warrant secondary w/u for HTN following this acute phase.  **AGMA:  AKI and suspect a component of lactic acidosis - CRRT should correct.    Tyler Pita Oct 11, 2020, 6:13 PM

## 2020-09-11 NOTE — Progress Notes (Signed)
Paged Dr. Chestine Spore and MD called back.  RN made MD aware that patient is complaining of heaviness and swelling of her legs. +2 palpable pedal pulses bilaterally. Patient only able to wiggle toes on right foot and has flicker of muscle to left knee when trying to move her left leg and foot. Patient has sensation to both lower extremities however decreased sensation in her left lower extremity. Dr. Chestine Spore stated he would come to bedside and assess patient.

## 2020-09-11 NOTE — Progress Notes (Addendum)
Report given to Brantley Fling, RNs. Assessed patient at bedside and patient moving right foot and has flicker of movement to left leg.  +2 bilateral pedal pulses.  Cleviprex drip infusing at max dose and esmolol drip was restarted to maintain systolic blood pressure less than 140.

## 2020-09-11 NOTE — Addendum Note (Signed)
Addendum  created 09/09/2020 1130 by Adair Laundry, CRNA   Order list changed

## 2020-09-11 NOTE — Progress Notes (Signed)
RT transported patient from 2H19 to CT and back. No complications. RT will continue to monitor.

## 2020-09-11 NOTE — Op Note (Signed)
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  Ultrasound-guided access of left common femoral artery with percutaneous closure for delivery of endograft 2.  Intravascular ultrasound (IVUS) of infrarenal abdominal aorta, suprarenal abdominal aorta, and distal thoracic aorta 3.  Abdominal aortogram 4.  Endovascular stent graft of supra-renal abdominal aorta including coverage of the celiac artery (31 mm x 31 mm x 10 cm Gore thoracic endograft) 5.  Exploratory laparotomy for decompression of abdominal compartment syndrome 6.  Ultrasound-guided access of right common femoral vein for placement of dialysis catheter  Surgeon: Dr. Cephus Shelling, MD  Co-surgeon: Dr. Lemar Livings, MD  Assistant: Nathanial Rancher, PA  Indications: Patient is a 34 year old female that presented on Saturday night with type B descending aortic dissection with contained rupture in the mediastinum and malperfusion with acute renal failure.  Ultimately she was showing clinical improvement and her hemoglobin had responded after stent grafting on Saturday night.  Today she became hemodynamically unstable with hypotension requiring pressors and repeat CT scan with concern for rupture of the abdominal aorta distal to the existing stent graft.  She presents emergently after risk benefits discussed with family.  Findings: Ultrasound-guided access of left common femoral artery and abdominal aortogram showed no evidence of active extravasation to suggest rupture.  The stent graft was extended from the previous stent graft in the descending thoracic aorta for an additional 4 cm of coverage including coverage of the celiac artery and the stent was landed just  above the SMA with preservation of the SMA.  The renal arteries showed very limited perfusion.  She also developed increasing abdominal distention with acidosis and decompressive laparotomy was performed with evacuation of large volume of serous fluid.  There was some RP blood.  All the bowel appeared viable.  The abdomen was left open with a small laparotomy incision. Temporary dialysis catheter was placed in the right common femoral vein to initiate hemodialysis.  Anesthesia: General  Details: Patient was taken emergently to OR 16 after risk benefits were discussed with family.  She was placed on operative table supine position.  General endotracheal anesthesia was induced.  She got preoperative antibiotics.  Timeout was performed.  Initially evaluated left common femoral artery with ultrasound it was patent and image was saved.  This was accessed with micro access needle, placed a microwire, and then made a stab incision down on the wire and then ultimately placed a micro sheath.  Bentson wire was placed into the abdominal aorta and used a 8 French sheath to dilate the arteriotomy.  Perclose device was deployed at 10:00 and 2:00 in the left common femoral artery.  That point time we then put a long wire into the stent graft in thoracic aorta.  We then performed IVUS of the infrarenal suprarenal abdominal aorta and into the existing stent graft to ensure we were in the true lumen of the dissection.  We identified the renal arteries as well as SMA and celiac and renal vein.  That point time we then elected to go ahead and proceed with extending her stent graft given the concern on CT today with possible rupture in the abdominal aorta at level of celiac artery.  Patient was given 100 units/kg heparin.  That point time we exchanged for a  double curve Lundy wire for more support.  A 20 French Gore dry seal sheath was placedin the left common femoral artery and advanced into the existing stent graft in the distal  descending thoracic aorta.  The stent device was placed into supra-renal aorta.  That point in time we then buddy wired a pigtail catheter up and got very steep right oblique steep imaging to identify the SMA and celiac artery.  We buddy wired the SMA and this was cannulated with a Omni Flush catheter and [employer].  The stent graft was deployed from existing descending thoracic aortic stent graft to just above SMA and we did cover the celiac.  Final abdominal aortogram showed inline flow within the stent and she did have some filling of the false lumen distally but no active extravasation.  The SMA was preserved.  The renal arteries are poorly visualized.  That point time wires and catheters were removed and we tied down the Perclose device in the left common femoral artery after sheath was removed.  We then elected to perform decompressive laparotomy given concern for abdominal compartment syndrome and ongoing distention with worsening acidosis.  Small midline laparotomy was made with a 15 blade scalpel.  The fascia in the midline was divided with Bovie cautery entered the peritoneal cavity.  Ultimately large volume of serous fluid was evacuated.  There was blood in the retroperitoneum.  We evaluated all the colon small bowel gallbladder as well as into the pelvis and did not see any evidence of ischemic tissue and all the bowel and other organs appeared viable.  A abdominal VAC was then applied and the midline was left open given concern for compartment syndrome.  The right common femoral vein was then evaluated ultrasound it was patent image was saved.  This was accessed with an 18-gauge needle under ultrasound guidance and ultimately a J-wire was placed and then a Trialysis temporary dialysis catheter was secured and loaded with heparinized saline.  She was taken to ICU in critical condition.  Condition: Critical  Complications: None  Cephus Shelling, MD [facility] Office: [phone]   Cephus Shelling

## 2020-09-11 NOTE — Progress Notes (Signed)
   09-17-2020 1631  Clinical Encounter Type  Visited With Family  Visit Type Follow-up  Referral From Chaplain  Consult/Referral To Chaplain  Chaplain followed up. Patient had surgery. Chaplain visited with the patient's sister, [place] and her fiance' Justice, while they were in consult with Dr. Chestine Spore. Family is praying and believing patient will survive. They are prepared to provided at home care. Family is not ready for the patient to be at the end of her life. This note was prepared by Deneen Harts, M.Div..  For questions please contact by phone [phone].

## 2020-09-11 NOTE — Progress Notes (Signed)
RT pulled ETT back 3cm per MD verbal order at bedside. ETT was 25@lips , ETT is now 22@lips . CXR confirming at the bedside at this time. RT will continue to monitor.

## 2020-09-11 NOTE — Transfer of Care (Addendum)
Immediate Anesthesia Transfer of Care Note  Patient: Glenda Fuller  Procedure(s) Performed: abdominal AORTOGRAM (N/A Abdomen) ABDOMINAL AORTIC ENDOVASCULAR STENT GRAFT insertion (N/A Abdomen) ULTRASOUND GUIDANCE FOR VASCULAR ACCESS, left femoral artery (Left Groin) INTRAVASCULAR ULTRASOUND, abdominal and thoracic aorta (N/A Groin) EXPLORATORY LAPAROTOMY (N/A Abdomen) APPLICATION OF abdominal WOUND VAC (N/A Abdomen) INSERTION OF  POWER TRIALYSIS SHORT-TERM STRAIGHT DIALYSIS CATHETER INTO RIGHT GROIN (Right Groin)  Patient Location: ICU  Anesthesia Type:General  Level of Consciousness: sedated and unresponsive  Airway & Oxygen Therapy: Patient remains intubated per anesthesia plan and Patient placed on Ventilator (see vital sign flow sheet for setting)  Post-op Assessment: Report given to RN and Post -op Vital signs reviewed and stable  Post vital signs: Reviewed and stable  Last Vitals:  Vitals Value Taken Time  BP    Temp    Pulse 64 08/26/2020 1739  Resp 25 09/10/2020 1739  SpO2 93 % 09/07/2020 1739  Vitals shown include unvalidated device data.  Last Pain:  Vitals:   09/02/2020 1443  TempSrc: Oral  PainSc:       Patients Stated Pain Goal: 0 (08/25/2020 0816)  Complications: No complications documented.

## 2020-09-11 NOTE — Progress Notes (Addendum)
34 yo F POD2 descending thoracic stent graft for type B thoracic dissection, with new onset paraparesis 10/18, with some improvement after spinal drain placement, who had acute mental status change + shaking and "foaming at the mouth" with associated hypotension 10/18 late morning requiring intubation and initiation of pressors. VVS, PCCM, Neurology at bedside at this time.  CT H without evidence of stroke. CTA c/a/p with some concern for possible rupture distal to stent graft. Plan for return OR for aortagram, possible extension of stent graft outlined by VVS.   Seen in follow up 10/18 afternoon and ICP noted to be low at 6. D/w both Neuro and VVS, with possible sz this morning + risk of low ICP precipitating sz, drain may need to be clamped.  On f/u exam: Patient fluctuating mental status with periods where she does follow commands.  When calm, pt does wiggle R toes to command   P -OR with VVS now -will ask nursing team to re-zero spinal drain post op and verify that is transducing accurately -goal ICP 8-12, target 10. If low may need to clamp -post op SBP goal 120-140 -further neuro workup deferred until post-op   Family at bedside and serially updated  Tessie Fass MSN, AGACNP-BC [facility] Pulmonary/Critical Care Medicine [phone] If no answer, [phone] 09/22/2020, 4:20 PM

## 2020-09-11 NOTE — Progress Notes (Signed)
[facility]  Subjective  -I was called last night with no motor function in her bilateral lower extremities.  Ultimately after liberalizing her blood pressure parameters and getting a spinal drain from anesthesia she is able to wiggle her right foot and also her left toes this morning which is an improvement.  Still not much hip flexion.   Objective 126/66 61 98 F (36.7 C) (Oral) (!) 25 91%  Intake/Output Summary (Last 24 hours) at 09/19/2020 1000 Last data filed at 09/12/2020 0900 Gross per 24 hour  Intake 3315.43 ml  Output 286 ml  Net 3029.43 ml    Right groin clean dry and intact Palpable DP pulses bilateral lower extremities Can slightly plantarflex her right foot and wiggle her left toes which is an improvement, no flexion at the hip  Laboratory Lab Results: Recent Labs    09/10/20 0515 09/10/2020 0440  WBC 11.6* 12.3*  HGB 12.7 12.7  HCT 34.5* 34.0*  PLT 176 233   BMET Recent Labs    09/10/20 0515 09/17/2020 0440  NA 136 129*  K 3.8 3.8  CL 103 97*  CO2 14* 9*  GLUCOSE 143* 71  BUN 102* 94*  CREATININE 8.13* 7.81*  CALCIUM 8.0* 8.0*    COAG Lab Results  Component Value Date   INR 1.3 (H) 09/10/2020   No results found for: PTT  Assessment/Planning: POD #2 status post descending thoracic stent graft for type B thoracic dissection with contained rupture in the mediastinum as well as malperfusion of her kidneys with acute renal failure.  Hemoglobin remained stable at 12.7 which is a significant improvement from 5 on arrival Saturday night.  Creatinine is improved slightly to 7.8 today although she has had minimal urine output (105 mL in past 24 hours).  Will continue to watch for renal recovery.   Continue spinal drain as ordered and would leave that open today - can drain up to 30 mL/hr or 300 mL/24 hours.  Also continue to relax blood pressure parameters and okay with blood pressures in the 140s systolic to try and also  help perfuse her spinal cord.  Aspirin from my standpoint for stent graft.  Bilateral lower extremities are well perfused and her rihgt groin incision has no hematoma.  Cephus Shelling 09/10/2020 10:00 AM --

## 2020-09-11 NOTE — Progress Notes (Signed)
NAME:  Glenda Fuller, MRN:  [id number], DOB:  1986-02-01, LOS: 1 ADMISSION DATE:  08/30/2020, CONSULTATION DATE:  08/25/2020 REFERRING MD:  Adela Lank- [facility] EM, CHIEF COMPLAINT:  Back pain  // type B aortic dissection   Brief History   34 to F at [facility] ED found to have type B aortic dissection with mediastinal hematoma   History of present illness   34 yo F PMH HTN presents to [facility] ED from Urgent Care referral due to anemia Hgb 5.9. Pt initially presented to UC for worsening back pain, fatigue and SOB. Back pain began 2 weeks ago while pushing shopping cart, throbbing in nature and initially worsened with movement and improved with rest. 10/16 pain has moved, extending up the mid back. Labs revealed significant anemia as well as significant AKI. A CT renal stone study was obtained which was concerning for possible aortic dissection.CTA chest and pelvis revealed type B aortic dissection with rupture and mediastinal hematoma.  Pt BP 228/116 on presentation to [facility]. Started on esmolol and clevidipine infusions and is to be transferred to [facility] ED for admission to ICU.   Significant [facility] labs:  Hgb 5.4 HCT 16.1, hsTrop 338 Na 131 K 3.3 BUN 118 Cr 10.12 AG 20  Past Medical History  HTN  Significant Hospital Events   10/16 Referred to [facility] ED from UC for anemia. Found to have type B aortic dissection. On Esmolol and clevidipine for SBP > 200.  10/17 POD0 TEVAR 10/18 new onset paraparesis-- incr SBPgoal to <140. Spinal drain placed by anesthesia.   Consults:  Vascular surgery  Anesthesia Neurology  Procedures:  10/17 Procedure: 1.  Ultrasound-guided access of right common femoral artery with percutaneous closure for delivery of endografty 2.  Intravascular ultrasound (IVUS) of right iliac artery, abdominal aorta, descending thoracic aorta, aortic arch, and ascending aorta 3.  Thoracic aortogram 4.  Endovascular stent graft of descending thoracic aortic dissection without coverage of the left  subclavian artery (31 mm x 31 mm x 20 cm Gore thoracic endograft) 5.  Right iliac arteriogram  10/18 spinal drain >  Significant Diagnostic Tests:  CT renal stone study> suspected aortic dissection originating in chest witn mediastinal hematoma. Hypertrophic LV. 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 Small bilateral pleural effusion, mild pulmonary edema  CTA c/a/p> Moderate hematoma extending from isthmus of aorta nearly to diaphragm. LV hypertrophy. Aortic Dissection beginning at isthmus of aorta and continuing into abdomen. False lumen compresses true lumen. Dissection terminates at livel of aortic bifurcation. True lumen serves celiac axis, SMA, bilateral renal arteries   Micro Data:  SARS Cov2> neg  Flu A / B> neg  Antimicrobials:    Interim history/subjective:  Overnight spinal drain placed by anesthesia after new-onset loss of motor function BLE and decreased BLE sensation Remains on clevidipine and esmolol, SBP goal liberalized to 140-150 per VVS  AM labs are pending  Discussed updates and course with VVS at bedside  Objective   Blood pressure 105/75, pulse (!) 46, temperature 97.7 F (36.5 C), resp. rate 17, height 5' (1.524 m), weight 44.9 kg, last menstrual period 07/30/2020, SpO2 94 %.        Intake/Output Summary (Last 24 hours) at 09/10/2020 1100 Last data filed at 09/10/2020 0801 Gross per 24 hour  Intake 4458.79 ml  Output 155 ml  Net 4303.79 ml   Filed Weights   09/12/2020 1508 09/10/20 0322  Weight: 39.1 kg 44.9 kg    Examination:  General: WDWN adult F, reclined  in bed NAD HENT: NCAT pink mmm trachea midline anicteric sclera. [place] in place Lungs: Shallow, even, unlabored respirations. CTAb  Cardiovascular: RRR s1s2. 2+ peripheral pulses Abdomen: Soft flat ndnt +bowel sounds Extremities: No obvious joint deformity. No cyanosis or clubbing. BLE non-pitting edema Neuro: AAOx4 following commands. BUE 5/5. Some lower extremity inconsistency  this morning but most often RLE 2/5 LLE 1/5. +sharp sensation BLE, seems slightly diminished to light touch.  GU: Foley with scant amount of urine colelction  Resolved Hospital Problem list     Assessment & Plan:   Type B aortic dissection with significant kidney injury and mediastinal hematoma- s/p repair by Dr. Chestine Spore on 09/10/20 -post-op per VVS -ASA 81mg  and Subq Heparin per VVS  Hypertensive Emergency, Hx poorly controlled HTN-  P -SBP goal incr from <120 to <140 in setting of new onset paraparesis  - Scheduled hydral, coreg, norvasc-- wean esmolol and cleviprex gtt as able -PRN hydral available if needed  -f/u UDS   Paraparesis- suspect due to spinal vascular compromise.  -loss of motor function BLE 10/17 night.  -s/p Spinal drain 10/18 -some improvement to motor function 10/18 AM with LLE flicker and 2/5 RLE P -SBP < 140 for incr spinal perfusion -spinal drain per anesth.  -Neuro following -serial neuro exam  AKI with oliguria -Cr relatively stable at 8.13 10/17-- labs pending 10/18 P -will order 0500 1700 BMP + mag phos  -high risk for needing iHD-- will continue to assess labs for hard indications and if arise will consult nephrology -trend UOP, Renal indices -decr LR to 52ml/hr  ABLA- from mediastinal hematoma improved after transfusion -hgb now stabile P -monitor for s/sx bleeding -f/u labs    Hypoxia -suspect probably atelectasis in setting of bed rest in post op period. Possible edema or minor trali with prior product resusc  + IVF P -IS, flutter -Titrate O2 for SpO2 > 94% -if worsens get CXR    Best practice:  Diet: heart healthy Pain/Anxiety/Delirium protocol (if indicated): PRN dilaudid  VAP protocol (if indicated): na DVT prophylaxis: SQ Heparin and baby ASA per VVS  GI prophylaxis: na Glucose control: monitor Mobility: BR Code Status: Full  Family Communication: Pt updated at bedside with PCCM and VVS  Disposition: ICU   CRITICAL  CARE Performed by: 45m   Total critical care time: 48 minutes  Critical care time was exclusive of separately billable procedures and treating other patients. Critical care was necessary to treat or prevent imminent or life-threatening deterioration.  Critical care was time spent personally by me on the following activities: development of treatment plan with patient and/or surrogate as well as nursing, discussions with consultants, evaluation of patient's response to treatment, examination of patient, obtaining history from patient or surrogate, ordering and performing treatments and interventions, ordering and review of laboratory studies, ordering and review of radiographic studies, pulse oximetry and re-evaluation of patient's condition.  Lanier Clam MSN, AGACNP-BC [place] Pulmonary/Critical Care Medicine Tessie Fass If no answer, [phone] 09/06/2020, 7:19 AM

## 2020-09-11 NOTE — Progress Notes (Signed)
Called this evening by ICU nurse regarding patient unable to move her lower extremities.  This is new as of this evening.  She is now POD#1 s/p TEVAR w/o coverage of left subclavian artery using 31 mm x 20 mm TAG for extensive type B dissection as previously noted.  On my exam she has no hip flexion and unable to wiggle her toes.  She does endorse sensation bilaterally down to the feet.  We will try to improve spinal perfusion by liberalizing her blood pressure goals and allow systolics up to 140.  I have also talked to Dr. Adonis Huguenin with anesthesiology about placing a spinal drain and orders have been placed for management of the spinal drain to also help spinal cord perfusion.  I have also asked neurology to see her tonight.  Discussed with patient that this can be very poor prognosis and we will try and do everything we can at this time to see if she has any clinical improvment.  Cephus Shelling, MD [facility] [place] Office: [phone]   Cephus Shelling

## 2020-09-11 NOTE — TOC Initial Note (Addendum)
Transition of Care [facility]) - Initial/Assessment Note    Patient Details  Name: Glenda Fuller MRN: [id number] Date of Birth: September 26, 1986  Transition of Care [facility]) CM/SW Contact:    Epifanio Lesches, RN Phone Number: 08/30/2020, 11:25 AM  Clinical Narrative:      Presented with c/o severe back pain, elevated creatinine, CTA showed  type B aortic dissection with thoracic hematoma/blush. Hx of HTN.         - s/p  U/S guided R Common Femoral Artery Access.  Intravascular Ultrasound of Right Iliac Artery, Abdominal Aorta, Thoracic Aorta, Ascending and Descending  Aorta.  Right Iliac Arteriogram with Right Femoral Artery Perclose.  Insertion Thoracic Endovascular Stent Graft , 09/10/2020  NCM introduce self to pt and pt's sister, Glenda Fuller @ bedside. Pt gave NCM the permission to speak with sister.  Sister stated PTA  independent with ADL's, no DME usage. States  works part time for [employer]. Resides with children's father and a son. Pt without PCP, no health insurance.      Glenda Fuller (Sister)     [phone]        [facility] team following for TOC needs .Marland Kitchen...  Barriers to Discharge: Continued Medical Work up   Patient Goals and CMS Choice        Expected Discharge Plan and Services                                                Prior Living Arrangements/Services                       Activities of Daily Living Home Assistive Devices/Equipment: None    Permission Sought/Granted                  Emotional Assessment              Admission diagnosis:  Elevated troponin [R77.8] Symptomatic anemia [D64.9] Thoracoabdominal aortic dissection (HCC) [I71.03] Renal failure, unspecified chronicity [N19] Patient Active Problem List   Diagnosis Date Noted  . Symptomatic anemia 09/16/2020  . Thoracoabdominal aortic dissection (HCC) 09/12/2020   PCP:  Patient, No Pcp Per Pharmacy:   [facility] [address] [place], [place] - [address] [place] [address] Phone: [phone] Fax: [phone]     Social Determinants of Health (SDOH) Interventions    Readmission Risk Interventions No flowsheet data found.

## 2020-09-11 NOTE — Anesthesia Procedure Notes (Signed)
Date/Time: Sep 12, 2020 3:21 PM Performed by: Gwenyth Allegra, CRNA Pre-anesthesia Checklist: Patient identified, Emergency Drugs available, Suction available, Patient being monitored and Timeout performed Patient Re-evaluated:Patient Re-evaluated prior to induction Oxygen Delivery Method: Circle system utilized Induction Type: IV induction

## 2020-09-11 NOTE — Progress Notes (Signed)
   09/15/2020 1300  Clinical Encounter Type  Visited With Patient and family together  Visit Type Follow-up  Referral From Nurse  Consult/Referral To Chaplain  Chaplain responded. Fiance and stepsister requested prayer prior to patient going into surgery.This note was prepared by Deneen Harts, M.Div..  For questions please contact by phone [phone].

## 2020-09-11 NOTE — Progress Notes (Signed)
EEG complete - results pending 

## 2020-09-12 ENCOUNTER — Encounter (HOSPITAL_COMMUNITY): Payer: Self-pay | Admitting: Vascular Surgery

## 2020-09-12 ENCOUNTER — Inpatient Hospital Stay (HOSPITAL_COMMUNITY): Payer: Medicaid Other

## 2020-09-12 ENCOUNTER — Encounter (HOSPITAL_COMMUNITY): Payer: Self-pay | Admitting: Anesthesiology

## 2020-09-12 DIAGNOSIS — Z7189 Other specified counseling: Secondary | ICD-10-CM

## 2020-09-12 DIAGNOSIS — G911 Obstructive hydrocephalus: Secondary | ICD-10-CM

## 2020-09-12 DIAGNOSIS — G96810 Intracranial hypotension, unspecified: Secondary | ICD-10-CM

## 2020-09-12 DIAGNOSIS — Z978 Presence of other specified devices: Secondary | ICD-10-CM

## 2020-09-12 DIAGNOSIS — R4182 Altered mental status, unspecified: Secondary | ICD-10-CM

## 2020-09-12 DIAGNOSIS — I614 Nontraumatic intracerebral hemorrhage in cerebellum: Secondary | ICD-10-CM

## 2020-09-12 LAB — POCT I-STAT 7, (LYTES, BLD GAS, ICA,H+H)
Acid-base deficit: 12 mmol/L — ABNORMAL HIGH (ref 0.0–2.0)
Acid-base deficit: 8 mmol/L — ABNORMAL HIGH (ref 0.0–2.0)
Acid-base deficit: 11 mmol/L — ABNORMAL HIGH (ref 0.0–2.0)
Acid-base deficit: 10 mmol/L — ABNORMAL HIGH (ref 0.0–2.0)
Bicarbonate: 15.3 mmol/L — ABNORMAL LOW (ref 20.0–28.0)
Bicarbonate: 17.8 mmol/L — ABNORMAL LOW (ref 20.0–28.0)
Bicarbonate: 13.3 mmol/L — ABNORMAL LOW (ref 20.0–28.0)
Bicarbonate: 14.7 mmol/L — ABNORMAL LOW (ref 20.0–28.0)
Calcium, Ion: 0.99 mmol/L — ABNORMAL LOW (ref 1.15–1.40)
Calcium, Ion: 0.93 mmol/L — ABNORMAL LOW (ref 1.15–1.40)
Calcium, Ion: 0.88 mmol/L — CL (ref 1.15–1.40)
Calcium, Ion: 0.89 mmol/L — CL (ref 1.15–1.40)
HCT: 36 % (ref 36.0–46.0)
HCT: 31 % — ABNORMAL LOW (ref 36.0–46.0)
HCT: 38 % (ref 36.0–46.0)
HCT: 39 % (ref 36.0–46.0)
Hemoglobin: 12.2 g/dL (ref 12.0–15.0)
Hemoglobin: 10.5 g/dL — ABNORMAL LOW (ref 12.0–15.0)
Hemoglobin: 12.9 g/dL (ref 12.0–15.0)
Hemoglobin: 13.3 g/dL (ref 12.0–15.0)
O2 Saturation: 100 %
O2 Saturation: 95 %
O2 Saturation: 94 %
O2 Saturation: 100 %
Patient temperature: 35.5
Patient temperature: 35.5
Patient temperature: 97.8
Patient temperature: 98.7
Potassium: 3.3 mmol/L — ABNORMAL LOW (ref 3.5–5.1)
Potassium: 2.9 mmol/L — ABNORMAL LOW (ref 3.5–5.1)
Potassium: 4.3 mmol/L (ref 3.5–5.1)
Potassium: 4.2 mmol/L (ref 3.5–5.1)
Sodium: 128 mmol/L — ABNORMAL LOW (ref 135–145)
Sodium: 132 mmol/L — ABNORMAL LOW (ref 135–145)
Sodium: 125 mmol/L — ABNORMAL LOW (ref 135–145)
Sodium: 125 mmol/L — ABNORMAL LOW (ref 135–145)
TCO2: 16 mmol/L — ABNORMAL LOW (ref 22–32)
TCO2: 19 mmol/L — ABNORMAL LOW (ref 22–32)
TCO2: 14 mmol/L — ABNORMAL LOW (ref 22–32)
TCO2: 16 mmol/L — ABNORMAL LOW (ref 22–32)
pCO2 arterial: 35.2 mmHg (ref 32.0–48.0)
pCO2 arterial: 35.5 mmHg (ref 32.0–48.0)
pCO2 arterial: 25.2 mmHg — ABNORMAL LOW (ref 32.0–48.0)
pCO2 arterial: 28.3 mmHg — ABNORMAL LOW (ref 32.0–48.0)
pH, Arterial: 7.238 — ABNORMAL LOW (ref 7.350–7.450)
pH, Arterial: 7.3 — ABNORMAL LOW (ref 7.350–7.450)
pH, Arterial: 7.329 — ABNORMAL LOW (ref 7.350–7.450)
pH, Arterial: 7.322 — ABNORMAL LOW (ref 7.350–7.450)
pO2, Arterial: 235 mmHg — ABNORMAL HIGH (ref 83.0–108.0)
pO2, Arterial: 76 mmHg — ABNORMAL LOW (ref 83.0–108.0)
pO2, Arterial: 73 mmHg — ABNORMAL LOW (ref 83.0–108.0)
pO2, Arterial: 234 mmHg — ABNORMAL HIGH (ref 83.0–108.0)

## 2020-09-12 LAB — CBC WITH DIFFERENTIAL/PLATELET
Abs Immature Granulocytes: 0.22 10*3/uL — ABNORMAL HIGH (ref 0.00–0.07)
Basophils Absolute: 0 10*3/uL (ref 0.0–0.1)
Basophils Relative: 0 %
Eosinophils Absolute: 0.1 10*3/uL (ref 0.0–0.5)
Eosinophils Relative: 1 %
HCT: 36.4 % (ref 36.0–46.0)
Hemoglobin: 12.5 g/dL (ref 12.0–15.0)
Immature Granulocytes: 2 %
Lymphocytes Relative: 0 %
Lymphs Abs: 0 10*3/uL — ABNORMAL LOW (ref 0.7–4.0)
MCH: 26.5 pg (ref 26.0–34.0)
MCHC: 34.3 g/dL (ref 30.0–36.0)
MCV: 77.3 fL — ABNORMAL LOW (ref 80.0–100.0)
Monocytes Absolute: 0.3 10*3/uL (ref 0.1–1.0)
Monocytes Relative: 3 %
Neutro Abs: 11.1 10*3/uL — ABNORMAL HIGH (ref 1.7–7.7)
Neutrophils Relative %: 94 %
Platelets: 150 10*3/uL (ref 150–400)
RBC: 4.71 MIL/uL (ref 3.87–5.11)
RDW: 20.7 % — ABNORMAL HIGH (ref 11.5–15.5)
WBC: 11.8 10*3/uL — ABNORMAL HIGH (ref 4.0–10.5)
nRBC: 13.1 % — ABNORMAL HIGH (ref 0.0–0.2)

## 2020-09-12 LAB — COMPREHENSIVE METABOLIC PANEL
ALT: 12 U/L (ref 0–44)
AST: 54 U/L — ABNORMAL HIGH (ref 15–41)
Albumin: 1.2 g/dL — ABNORMAL LOW (ref 3.5–5.0)
Alkaline Phosphatase: 86 U/L (ref 38–126)
Anion gap: 14 (ref 5–15)
BUN: 59 mg/dL — ABNORMAL HIGH (ref 6–20)
CO2: 12 mmol/L — ABNORMAL LOW (ref 22–32)
Calcium: 5.7 mg/dL — CL (ref 8.9–10.3)
Chloride: 95 mmol/L — ABNORMAL LOW (ref 98–111)
Creatinine, Ser: 5.03 mg/dL — ABNORMAL HIGH (ref 0.44–1.00)
GFR, Estimated: 10 mL/min — ABNORMAL LOW (ref >60)
Glucose, Bld: 357 mg/dL — ABNORMAL HIGH (ref 70–99)
Potassium: 4.5 mmol/L (ref 3.5–5.1)
Sodium: 121 mmol/L — ABNORMAL LOW (ref 135–145)
Total Bilirubin: 0.7 mg/dL (ref 0.3–1.2)
Total Protein: 3.7 g/dL — ABNORMAL LOW (ref 6.5–8.1)

## 2020-09-12 LAB — RENAL FUNCTION PANEL
Albumin: 1.8 g/dL — ABNORMAL LOW (ref 3.5–5.0)
BUN: 67 mg/dL — ABNORMAL HIGH (ref 6–20)
CO2: 13 mmol/L — ABNORMAL LOW (ref 22–32)
Calcium: 6 mg/dL — CL (ref 8.9–10.3)
Chloride: 101 mmol/L (ref 98–111)
Creatinine, Ser: 5.47 mg/dL — ABNORMAL HIGH (ref 0.44–1.00)
GFR, Estimated: 9 mL/min — ABNORMAL LOW (ref >60)
Glucose, Bld: 81 mg/dL (ref 70–99)
Phosphorus: UNDETERMINED mg/dL (ref 2.5–4.6)
Potassium: UNDETERMINED mmol/L (ref 3.5–5.1)
Sodium: UNDETERMINED mmol/L (ref 135–145)

## 2020-09-12 LAB — APTT: aPTT: 51 seconds — ABNORMAL HIGH (ref 24–36)

## 2020-09-12 LAB — URINALYSIS, ROUTINE W REFLEX MICROSCOPIC
Bilirubin Urine: NEGATIVE
Glucose, UA: 150 mg/dL — AB
Ketones, ur: NEGATIVE mg/dL
Nitrite: NEGATIVE
Protein, ur: 100 mg/dL — AB
Specific Gravity, Urine: 1.016 (ref 1.005–1.030)
WBC, UA: >50 WBC/hpf — ABNORMAL HIGH (ref 0–5)
pH: 5 (ref 5.0–8.0)

## 2020-09-12 LAB — CBC
HCT: 34.8 % — ABNORMAL LOW (ref 36.0–46.0)
Hemoglobin: 11.7 g/dL — ABNORMAL LOW (ref 12.0–15.0)
MCH: 27.1 pg (ref 26.0–34.0)
MCHC: 33.6 g/dL (ref 30.0–36.0)
MCV: 80.6 fL (ref 80.0–100.0)
Platelets: 243 10*3/uL (ref 150–400)
RBC: 4.32 MIL/uL (ref 3.87–5.11)
RDW: 22.8 % — ABNORMAL HIGH (ref 11.5–15.5)
WBC: 4.2 10*3/uL (ref 4.0–10.5)
nRBC: 5.5 % — ABNORMAL HIGH (ref 0.0–0.2)

## 2020-09-12 LAB — GLUCOSE, CAPILLARY
Glucose-Capillary: 60 mg/dL — ABNORMAL LOW (ref 70–99)
Glucose-Capillary: 67 mg/dL — ABNORMAL LOW (ref 70–99)
Glucose-Capillary: 76 mg/dL (ref 70–99)
Glucose-Capillary: 89 mg/dL (ref 70–99)
Glucose-Capillary: 76 mg/dL (ref 70–99)
Glucose-Capillary: 131 mg/dL — ABNORMAL HIGH (ref 70–99)
Glucose-Capillary: 68 mg/dL — ABNORMAL LOW (ref 70–99)
Glucose-Capillary: 110 mg/dL — ABNORMAL HIGH (ref 70–99)
Glucose-Capillary: 112 mg/dL — ABNORMAL HIGH (ref 70–99)
Glucose-Capillary: 317 mg/dL — ABNORMAL HIGH (ref 70–99)
Glucose-Capillary: 413 mg/dL — ABNORMAL HIGH (ref 70–99)

## 2020-09-12 LAB — MAGNESIUM
Magnesium: 2.2 mg/dL (ref 1.7–2.4)
Magnesium: 1.7 mg/dL (ref 1.7–2.4)

## 2020-09-12 LAB — GAMMA GT: GGT: 13 U/L (ref 7–50)

## 2020-09-12 LAB — LACTATE DEHYDROGENASE: LDH: 623 U/L — ABNORMAL HIGH (ref 98–192)

## 2020-09-12 LAB — TRIGLYCERIDES
Triglycerides: 3560 mg/dL — ABNORMAL HIGH (ref <150)
Triglycerides: 1062 mg/dL — ABNORMAL HIGH (ref <150)

## 2020-09-12 LAB — LIPASE, BLOOD: Lipase: 71 U/L — ABNORMAL HIGH (ref 11–51)

## 2020-09-12 LAB — PHOSPHORUS: Phosphorus: 6.3 mg/dL — ABNORMAL HIGH (ref 2.5–4.6)

## 2020-09-12 LAB — PROTIME-INR
INR: 1.6 — ABNORMAL HIGH (ref 0.8–1.2)
Prothrombin Time: 18.7 seconds — ABNORMAL HIGH (ref 11.4–15.2)

## 2020-09-12 LAB — BILIRUBIN, DIRECT: Bilirubin, Direct: 0.2 mg/dL (ref 0.0–0.2)

## 2020-09-12 LAB — AMYLASE: Amylase: 163 U/L — ABNORMAL HIGH (ref 28–100)

## 2020-09-12 MED ORDER — INSULIN ASPART 100 UNIT/ML ~~LOC~~ SOLN
0.0000 [IU] | SUBCUTANEOUS | Status: DC
  Administered 2020-09-12: 6 [IU] via SUBCUTANEOUS

## 2020-09-12 MED ORDER — SODIUM BICARBONATE 8.4 % IV SOLN
INTRAVENOUS | Status: DC
  Filled 2020-09-12: qty 850

## 2020-09-12 MED ORDER — SODIUM CHLORIDE 0.9 % IV SOLN
2.0000 g | Freq: Two times a day (BID) | INTRAVENOUS | Status: DC
  Administered 2020-09-12: 2 g via INTRAVENOUS
  Filled 2020-09-12: qty 2

## 2020-09-12 MED ORDER — STERILE WATER FOR INJECTION IV SOLN: INTRAVENOUS | Status: DC

## 2020-09-12 MED ORDER — VANCOMYCIN HCL IN DEXTROSE 1-5 GM/200ML-% IV SOLN
1000.0000 mg | Freq: Once | INTRAVENOUS | Status: AC
  Administered 2020-09-12: 1000 mg via INTRAVENOUS
  Filled 2020-09-12: qty 200

## 2020-09-12 MED ORDER — DEXTROSE 50 % IV SOLN
INTRAVENOUS | Status: AC
  Administered 2020-09-12: 25 mL
  Filled 2020-09-12: qty 50

## 2020-09-12 MED ORDER — VANCOMYCIN HCL 500 MG/100ML IV SOLN: 500.0000 mg | INTRAVENOUS | Status: DC

## 2020-09-12 MED ORDER — CALCIUM GLUCONATE-NACL 1-0.675 GM/50ML-% IV SOLN
1.0000 g | Freq: Once | INTRAVENOUS | Status: AC
  Administered 2020-09-12: 1000 mg via INTRAVENOUS
  Filled 2020-09-12: qty 50

## 2020-09-12 MED ORDER — SODIUM CHLORIDE 0.9 % IV SOLN
1.0000 g | INTRAVENOUS | Status: DC
  Administered 2020-09-13: 1 g via INTRAVENOUS
  Filled 2020-09-12: qty 1

## 2020-09-12 MED ORDER — VASOPRESSIN 20 UNITS/100 ML INFUSION FOR SHOCK
0.0400 [IU]/min | INTRAVENOUS | Status: DC
  Administered 2020-09-12 – 2020-09-13 (×4): 0.04 [IU]/min via INTRAVENOUS
  Filled 2020-09-12 (×5): qty 100

## 2020-09-12 MED ORDER — HYPROMELLOSE (GONIOSCOPIC) 2.5 % OP SOLN
1.0000 [drp] | Freq: Four times a day (QID) | OPHTHALMIC | Status: DC
  Administered 2020-09-12 – 2020-09-13 (×4): 1 [drp] via OPHTHALMIC
  Filled 2020-09-12: qty 15

## 2020-09-12 NOTE — Progress Notes (Signed)
PCCM Interval Note  Updated Neurology and Vascular team regarding patient's CT head.  Updated family on change in clinical status and brain imaging. In the setting of multi-organ failure she is unlikely to survive this hospitalization. Sister and fiance will contact remaining family. They do not wish to prolong life support. Plan to withdraw care today.  CC: 35 min  Mechele Collin, M.D. [facility] Pulmonary/Critical Care Medicine 09/12/2020 12:05 PM

## 2020-09-12 NOTE — Treatment Plan (Signed)
Patient transitioning to comfort care. Off CRRT. Will sign off from nephrology perspective. Thank you for allowing [place] to participate in the care of this patient.  Anthony Sar, MD [facility]

## 2020-09-12 NOTE — Progress Notes (Signed)
Pharmacy Antibiotic Note  Glenda Fuller is a 34 y.o. female admitted on 09/07/2020 with sepsis. Initially admitted for type B descending thoracic dissection + hypertensive emergency.   Patient now off CRRT and is being evaluated by donor services. They are requesting antibiotics be continued. Received doses this morning, due again tomorrow.  Plan: Cefepime 1g q24 hours Vancomycin 500mg  IV q48 hours  Height: 5' (152.4 cm) Weight: 48.9 kg (107 lb 12.9 oz) IBW/kg (Calculated) : 45.5  Temp (24hrs), Avg:95.7 F (35.4 C), Min:92.2 F (33.4 C), Max:98.1 F (36.7 C)  Recent Labs  Lab 09/10/20 0210 09/10/20 0210 09/10/20 0515 09/10/20 0515 08/26/2020 0440 08/30/2020 1056 09/12/2020 1309 09/20/2020 1834 09/03/2020 2133 09/12/20 0246 09/12/20 0500  WBC 13.9*  --  11.6*  --  12.3*  --   --   --  4.7  --  4.2  CREATININE 8.29*   < > 8.13*   < > 7.81* 8.90* 7.70* 7.63*  --  5.47*  --    < > = values in this interval not displayed.    Estimated Creatinine Clearance: 10.3 mL/min (A) (by C-G formula based on SCr of 5.47 mg/dL (H)).    No Known Allergies  Antimicrobials this admission: Vancomycin 10/19 >> Cefepime 10/19 >>  Microbiology results: 10/7 MRSA PCR - negative  10/19 Blood cx x2 - sent  Thank you for allowing pharmacy to be a part of this patient's care.  11/19 PharmD., BCPS Clinical Pharmacist 09/12/2020 6:03 PM   Please check [email or web address] for unit-specific pharmacy phone numbers.

## 2020-09-12 NOTE — Progress Notes (Signed)
RT transported patient from 2H19 to CT and back with RN. No complications. RT will continue to monitor.

## 2020-09-12 NOTE — Progress Notes (Signed)
eLink Physician-Brief Progress Note Patient Name: Glenda Fuller DOB: 07-11-1986 MRN: [id number]   Date of Service  09/12/2020  HPI/Events of Note  BG still 60's.   eICU Interventions  - increase D10 to 100 ml/hr. Decrease Insuline gtt 0.08/kg/hr. - restraints re ordered   Discussed with bed side RN.         Ranee Gosselin 09/12/2020, 2:20 AM

## 2020-09-12 NOTE — Progress Notes (Signed)
LTM maint complete - no skin breakdown under:  FP1.FP2 f3

## 2020-09-12 NOTE — Progress Notes (Signed)
Spoke with Dr. Ophelia Charter nurse at the VVS office and she will notify him of changes in care.

## 2020-09-12 NOTE — Progress Notes (Addendum)
Pharmacy Antibiotic Note  Glenda Fuller is a 34 y.o. female admitted on 09/12/2020 with sepsis. Initially admitted for type B descending thoracic dissection + hypertensive emergency. SPatient requiring increased vasopressors and started on CRRT; now concerned for possible sepsis. Plan to start empiric antibiotics pending blood cultures. Pharmacy has been consulted for vancomycin and cefepime dosing.  Plan: Cefepime 2g IV q12h  Vancomycin 1g IV x1, then 500mg  IV q24h  F/u cultures, clinical progression, and LOT  Monitor daily CBC and CRRT duration/tolerability    Height: 5' (152.4 cm) Weight: 48.9 kg (107 lb 12.9 oz) IBW/kg (Calculated) : 45.5  Temp (24hrs), Avg:96 F (35.6 C), Min:92.2 F (33.4 C), Max:98 F (36.7 C)  Recent Labs  Lab 09/10/20 0210 09/10/20 0210 09/10/20 0515 10/04/2020 0440 10-04-2020 1056 10-04-20 1309 10-04-2020 1834 10/04/20 2133 09/12/20 0500  WBC 13.9*  --  11.6* 12.3*  --   --   --  4.7 4.2  CREATININE 8.29*   < > 8.13* 7.81* 8.90* 7.70* 7.63*  --   --    < > = values in this interval not displayed.    Estimated Creatinine Clearance: 7.4 mL/min (A) (by C-G formula based on SCr of 7.63 mg/dL (H)).    No Known Allergies  Antimicrobials this admission: Vancomycin 10/19 >> Cefepime 10/19 >>  Microbiology results: 10/7 MRSA PCR - negative  10/19 Blood cx x2 - sent   Thank you for allowing pharmacy to be a part of this patient's care.  11/19, PharmD PGY1 Acute Care Pharmacy Resident 09/12/2020 8:58 AM  Please check [email or web address] for unit-specific pharmacy phone numbers.

## 2020-09-12 NOTE — Anesthesia Post-op Follow-up Note (Signed)
  Anesthesia Follow-up Note  Patient: Glenda Fuller  Day #: 2  Date of Follow-up: 09/12/2020 Time: 9:27 AM  Last Vitals:  Vitals:   09/12/20 0700 09/12/20 0742  BP:  106/60  Pulse: 88 91  Resp: (!) 26 (!) 27  Temp:    SpO2: 98% 97%    Level of Consciousness: intubated and sedated  Pain: controlled   Side Effects:None  Catheter Site Exam: area clean,dry, intact. Catheter 13cm depth. Pink CSF fluid in drain tubing reduced from "deep red" per nursing, but appropriate drainage and plan from primary team to continue at this time. ICP 8 at the bedside  Anti-Coag Meds (From admission, onward)   Start     Dose/Rate Route Frequency Ordered Stop   09/01/2020 1825  heparin injection 1,000-6,000 Units        1,000-6,000 Units CRRT As needed 09/22/2020 1827         Plan: continue spinal drain in situ per surgeon request Nelle Don Feliberto Stockley

## 2020-09-12 NOTE — Procedures (Addendum)
Patient Name: Glenda Fuller  MRN: [id number]  Epilepsy Attending: Charlsie Quest  Referring Physician/Provider: Graceann Congress, PA Duration: 09/16/2020 2048 to 10/90/21 1143  Patient history: 34year old female with history of hypertension who initially presented with back pain and found to have type B descending aortic dissection status post endovascular aorta repair who was noted to have acute onset of agitation, drooling, mild foaming as well as shaking/shivering concerning for seizures.  EEG to evaluate for seizures.  Level of alertness: comatose  AEDs during EEG study: Keppra  Technical aspects: This EEG study was done with scalp electrodes positioned according to the 10-20 International system of electrode placement. Electrical activity was acquired at a sampling rate of 500Hz  and reviewed with a high frequency filter of 70Hz  and a low frequency filter of 1Hz . EEG data were recorded continuously and digitally stored.   Description: EEG showed continuous generalized 3 to 6 Hz theta-delta slowing. Around 5am on 09/12/2020 EEG became diffusely attenuated. Hyperventilation and photic stimulation were not performed.     ABNORMALITY - Continuous slow, generalized - Background attenuation, generalized  IMPRESSION: This study was initially suggestive of severe diffuse encephalopathy. After 5am on 10/13/2020, eeg showed profound diffuse encephalopathy nonspecific etiology. No seizures or epileptiform discharges were seen throughout the recording.  Nasif Bos 

## 2020-09-12 NOTE — Progress Notes (Signed)
RN from [facility] came to evaluate lumbar drain set up.

## 2020-09-12 NOTE — Progress Notes (Signed)
eLink Physician-Brief Progress Note Patient Name: Glenda Fuller DOB: 08-03-1986 MRN: [id number]   Date of Service  09/12/2020  HPI/Events of Note  BG 68, got half D50 again, asking to goup on D10. Already at 100 ml/hr.  eICU Interventions  Triglycerides decreasing and do not have pancreatitis. - will reduce Insuline to 2.5 units/hr continuous for now. Follow AM pending triglyceride level.      Intervention Category Intermediate Interventions: Other:  Ranee Gosselin 09/12/2020, 5:50 AM

## 2020-09-12 NOTE — Progress Notes (Signed)
MD Fields, on call anesthesiologist, and Elink notified and aware of cloudy pink tinged lumbar drain fluid. No change in coloration since 1900.

## 2020-09-12 NOTE — Progress Notes (Signed)
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 Pt initially presented to UC for worsening back pain, fatigue and SOB. Back pain began 2 weeks ago while pushing shopping cart, throbbing in nature and initially worsened with movement and improved with rest. 10/16 pain has moved, extending up the mid back. Labs revealed significant anemia as well as significant AKI. A CT renal stone study was obtained which was concerning for possible aortic dissection.CTA chest and pelvis revealed type B aortic dissection with rupture and mediastinal hematoma.  Pt BP 228/116 on presentation to [facility]. Started on esmolol and clevidipine infusions and is to be transferred to [facility] ED for admission to ICU.   Significant [facility] labs:  Hgb 5.4 HCT 16.1, hsTrop 338 Na 131 K 3.3 BUN 118 Cr 10.12 AG 20  Past Medical History  HTN  Significant Hospital Events   10/16 Referred to [facility] ED from UC for anemia. Found to have type B aortic dissection. On Esmolol and clevidipine for SBP > 200.  10/17 POD0 TEVAR 10/18 new onset paraparesis-- incr SBPgoal to <140. Spinal drain placed by anesthesia.   Consults:  Vascular surgery  Anesthesia Neurology  Procedures:  10/17 Art line> Procedure: 1.  Ultrasound-guided access of right common femoral artery with percutaneous closure for delivery of endografty 2.  Intravascular ultrasound (IVUS) of right iliac artery, abdominal aorta, descending thoracic aorta, aortic arch, and ascending aorta 3.  Thoracic aortogram 4.  Endovascular stent graft of descending thoracic aortic dissection without coverage of  the left subclavian artery (31 mm x 31 mm x 20 cm Gore thoracic endograft) 5.  Right iliac arteriogram  10/18 spinal drain > 10/18 ETT> 10/18 CVC> 10/18 HD cath>  10/18> abdominal aortagram. Stent graft extension to cover celiac artery, ex lap decompression of abdomen   Significant Diagnostic Tests:  CT renal stone study> suspected aortic dissection originating in chest witn mediastinal hematoma. Hypertrophic LV. 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 Small bilateral pleural effusion, mild pulmonary edema  CTA c/a/p> Moderate hematoma extending from isthmus of aorta nearly to diaphragm. LV hypertrophy. Aortic Dissection beginning at isthmus of aorta and continuing into abdomen. False lumen compresses true lumen. Dissection terminates at livel of aortic bifurcation. True lumen serves celiac axis, SMA, bilateral renal arteries   10/18 CT H non-con> no acute infarct. Incr density of dural venous sinuses, thickening and incr density of tentorium bilaterally and falx. Possible hypodenisty in transverse and sigmoid sinus bilaterally  10/18 CTA c/a/p> stable stent graft placement. Possible rupture in visceral segment distal to stent graft with possible rbp.   Micro Data:  SARS Cov2> neg  Flu A / B> neg  Antimicrobials:    Interim history/subjective:   Variable BP after surgery yesterday initially requiring pressors then esmolol. Overnight consistently requiring NE   This morning SBP 105 on 36 NE, adding vaso 0.04 Pupils are dilated and fixed, on fentanyl  Decreasing fentanyl this morning Continues on LTM  Objective   Blood pressure 105/75, pulse (!) 46, temperature 97.7 F (36.5 C), resp. rate  17, height 5' (1.524 m), weight 44.9 kg, last menstrual period 07/30/2020, SpO2 94 %.        Intake/Output Summary (Last 24 hours) at 09/10/2020 1100 Last data filed at 09/10/2020 0801 Gross per 24 hour  Intake 4458.79 ml  Output 155 ml  Net 4303.79 ml   Filed  Weights   09/19/2020 1508 09/10/20 0322  Weight: 39.1 kg 44.9 kg    Examination:  General: Critically ill appearing adult F, intubated sedated on CRRT without distress or response HENT: NCAT ETT OGT secure. Pink mmm anicteric sclera Lungs: Mechanically ventilated Cardiovascular: RRR s1s2 holosystolic murmur. Cap refill brisk. 2+ pulses  Abdomen: Round, soft, open midline lap incision. Wound vac in place collecting serosanguinous fluid  Extremities: No obvious joint deformity, no cyanosis or clubbing. Non-pitting BLE edema  Neuro: Examined on fentanyl. Pupils dilated and fixed. No response to painful stimuli, unable to illicit cough, No CN V v1 sensory response/blink. Spinal drain with some insertion site oozing. Pink-tinged, cloudy collection. GU: wnl  Resolved Hospital Problem list     Assessment & Plan:    Paraparesis- suspect due to spinal vascular compromise.  -loss of motor function BLE 10/17 night.  -s/p Spinal drain 10/18 -some improvement to motor function 10/18 AM with LLE flicker and 2/5 RLE. Later 10/18, l/o movement again in concurrence with AMS and hypotensive episode.  P -SBP 120-140 -spinal drain per anesth.  -Neuro following -serial neuro exam  AMS -- ?seizure 10/18 -10/18 pt w AMS, shaking, "foaming at mouth." Shaking/foaming self limited by time CCM at bedside. Pt remained significantly altered with grunting noises, dilated pupils, inability to follow commands, spontaneous movement of BUE and no BLE movement or response to painful stimuli. (?possible post-ictal state)  -CTH w/o evidence of CVA but shows incr density of dural venous sinuses, thickening and incr density of tentorium and falx -- venous congestion likely in context of present spinal drain. Possible hypodensity in transverse and sigmoid sinus -if this was a sz, possible that low ICP may have precipitated though this is difficult to teas out retrospectively as this may not have been transducing  correctly (significant discrepancy between ICP pre and post zeroing)  Dilated pupils 10/19 -on exam 10/19 AM, fixed and dilated pupils on fent. No response to pain. Very concerning exam for serious neurologic insult  P  -STAT CT H 10/19 in setting of pupil change and neuro exam change -neuro following  -f/u LTM read  -decr sedation as tolerated  Acute respiratory failure with hypoxia, poor airway protection requiring intubation P -MV support -PEEP and FiO2 for SpO2 goal > 92% -VAP -PAD -- wean sedation for further neuro assessment   Type B aortic dissection with significant kidney injury and mediastinal hematoma- s/p stent graft by Dr. Chestine Spore on 09/10/20 -return to OR 10/18 for abd aortagram which did not reveal rupture. Stent graft extended to include coverage of celiac artery. P -post-op per VVS  Shock -appears volume up, Hgb stable-- doubt hemorrhage or hypovolemia -Favor neuro vs cardiogenic. Possibly septic but less likely P -SBP goal 120-140 -NE, adding vaso -F/u CT H  -BCx, empiric vanc cefepime  Hypertensive Emergency- on presentation - Hx poorly controlled HTN -now with ongoing hypotension P -agree with Dr. Glenna Fellows (neprho)-- if survives this critical illness course, secondary workup for HTN   Acute renal failure -vascath by vvs 10/18 P -CRRT per nephrology  -poor tolerance of fluid removal overnight, now net even  -f/u labs and continue to trend.  With elevated trigs, pt labs are processed at OSH   Abdominal distension -s/p decompressive laparotomy 10/18 with [facility]VS with evac of lg. Volume serous fluid -bowel viable appearing P -abdomen open with small lap incision -fluid removal via CRRT  ABLA- from mediastinal hematoma improved after transfusion -hgb now stabile P -monitor for s/sx bleeding -f/u labs    Elevated triglycerides -high doses of clevidipine for HTN control Hypoglycemia P -started on insulin gtt 10/18, dc  -dextrose  infusion  Inadequate PO intake P -maintain NPO for now   Goals of Care -Please see prior family communication documentation by both [facility] and PCCM 10/18 -Pt remains critically ill, now with worsening shock, poor tolerance of CRRT and new neuro status changes. Awaiting CT H, EEG read, however this constellation of changes likely portents grim prognosis  -GOC conversations pending further diagnostic information   Best practice:  Diet: NPO Pain/Anxiety/Delirium protocol (if indicated): weaning fentanyl  VAP protocol (if indicated): na DVT prophylaxis: SQ Heparin and baby ASA per V[facility]  GI prophylaxis: na Glucose control: monitor Mobility: BR Code Status: Full  Family Communication: Fiance and sister [place] at bedside 10/19 AM.  Disposition: ICU   CRITICAL CARE Performed by: Lanier Clam   Total critical care time: 68 minutes  Critical care time was exclusive of separately billable procedures and treating other patients.  Critical care was necessary to treat or prevent imminent or life-threatening deterioration.  Critical care was time spent personally by me on the following activities: development of treatment plan with patient and/or surrogate as well as nursing, discussions with consultants, evaluation of patient's response to treatment, examination of patient, obtaining history from patient or surrogate, ordering and performing treatments and interventions, ordering and review of laboratory studies, ordering and review of radiographic studies, pulse oximetry and re-evaluation of patient's condition.  Tessie Fass MSN, AGACNP-BC [facility] [phone] If no answer, [phone] 09/12/2020, 7:17 AM

## 2020-09-12 NOTE — Progress Notes (Addendum)
CSW received consult from patients nurse Okey Regal. Patients family has questions regarding financial assistance for patient.CSW spoke with family and provided financial counseling resources to family. Family accepted.No further questions reported at this time.  CSW will continue to follow

## 2020-09-12 NOTE — Progress Notes (Signed)
PCCM Interval Note  Received page from Radiologist regarding STAT CT H ordered 09/12/2020 morning after new finding of fixed dilated pupils.  Unfortunately, this CT reveals a devastating insult:  -Intraparenchymal hemorrhage involving L cerebellum and vermis (4.8 x 3.5 x 2.4 cm) -SAH overlies bilateral cerebellar folia -SDH along interior tentorium -intraventricular extension of hemorrhage involving 3rd and 4th ventricles -complete effacement of fourth ventricle  -cerebellar tonsil descent, concerning for hernation -diffuse cerebral edema, no midline shift  Dr. Everardo All PCCM notified who is contacting Dr. Chestine Spore [facility]. Dr. Roda Shutters with neurology also notified   Will gather family for meeting to discuss these findings.   Tessie Fass MSN, AGACNP-BC [facility] [phone] If no answer, [phone] 09/12/2020, 9:31 AM

## 2020-09-12 NOTE — Progress Notes (Signed)
[facility] referral made. Referral #[id number]. Potential for donation. Bedside staff notified to expect a call from a coordinator.

## 2020-09-12 NOTE — Progress Notes (Signed)
LTM EEG discontinued - no skin breakdown at unhook.   

## 2020-09-12 NOTE — Progress Notes (Signed)
STROKE TEAM PROGRESS NOTE   INTERVAL HISTORY Pt RNs are at bedside. Pt had emergent surgery yesterday for Endovascular stent graft of supra-renalabdominal aorta and Exploratory laparotomy for decompression of abdominal compartment syndrome. Post op pt not responsive, on fentanyl drip, BP still on the low side even with maximized levophed. Lumbar drain was bloody. EEG flat. Pupil 33mm bilaterally, fixed and no brainstem response this am. Repeat CT showed left cerebellar ICH with IVH, hydrocephalus and intracranial hypotension. Poor prognosis. Discussed with Dr. Everardo All and NP Delorise Shiner.   Vitals:   09/12/20 1045 09/12/20 1100 09/12/20 1115 09/12/20 1130  BP:      Pulse: 89 88 88 88  Resp: (!) 26 (!) 26 (!) 26 (!) 26  Temp:      TempSrc:      SpO2: 100% 100% 100% 100%  Weight:      Height:       CBC:  Recent Labs  Lab 09/08/2020 2133 09/12/20 0500  WBC 4.7 4.2  HGB 14.1 11.7*  HCT 41.7 34.8*  MCV 80.2 80.6  PLT 264 243   Basic Metabolic Panel:  Recent Labs  Lab 09/02/2020 1834 08/26/2020 1834 09/06/2020 1835 09/12/20 0246  NA 131*   < > 131* UNABLE TO REPORT DUE TO LIPEMIC INTERFERENCE  K UNABLE TO REPORT DUE TO HEMOLYSIS   < > 3.1* UNABLE TO REPORT DUE TO LIPEMIC INTERFERENCE  CL 98  --   --  101  CO2 12*  --   --  13*  GLUCOSE 70  --   --  81  BUN 94*  --   --  67*  CREATININE 7.63*  --   --  5.47*  CALCIUM 5.7*  --   --  6.0*  MG 2.0  --   --  2.2  PHOS 9.1*  --   --  UNABLE TO REPORT DUE TO LIPEMIC INTERFERENCE   < > = values in this interval not displayed.   Lipid Panel:  Recent Labs  Lab 09/21/2020 0708 09/19/2020 1309 09/12/20 0246  CHOL 102  --   --   TRIG 3,914*   < > 1,062*  HDL NOT REPORTED DUE TO HIGH TRIGLYCERIDES  --   --   CHOLHDL NOT REPORTED DUE TO HIGH TRIGLYCERIDES  --   --   VLDL UNABLE TO CALCULATE IF TRIGLYCERIDE OVER 400 mg/dL  --   --   LDLCALC UNABLE TO CALCULATE IF TRIGLYCERIDE OVER 400 mg/dL  --   --    < > = values in this interval not displayed.    HgbA1c:  Recent Labs  Lab 09/12/2020 0729  HGBA1C 4.8   Urine Drug Screen: No results for input(s): LABOPIA, COCAINSCRNUR, LABBENZ, AMPHETMU, THCU, LABBARB in the last 168 hours.  Alcohol Level No results for input(s): ETH in the last 168 hours.  IMAGING past 24 hours DG Chest 1 View  Result Date: 09/12/2020 CLINICAL DATA:  Endotracheal and OG tube placements. History of thoracic aortic dissection. EXAM: CHEST  1 VIEW COMPARISON:  09/16/2020 FINDINGS: Endotracheal and enteric tubes as well as right central venous catheter are unchanged in position. Descending aortic stent graft. Cardiac enlargement. Probable small bilateral pleural effusions with basilar atelectasis or infiltration. Similar appearance to previous study. IMPRESSION: Stable chest. Cardiac enlargement. Probable small bilateral pleural effusions with basilar atelectasis or infiltration. Electronically Signed   By: Burman Nieves M.D.   On: 09/12/2020 06:12   DG Abd 1 View  Result Date: 09/12/2020 CLINICAL DATA:  OG  tube placement EXAM: ABDOMEN - 1 VIEW COMPARISON:  09/20/2020 FINDINGS: Enteric tube tip is in the left mid abdomen consistent with location in the body of the stomach. No change in position. Catheter is projected over the pelvis likely represent bilateral iliac vein catheters. Scattered gas and stool in the colon. No small or large bowel distention. IMPRESSION: Enteric tube tip is in the left mid abdomen consistent with location in the body of the stomach. Electronically Signed   By: Burman Nieves M.D.   On: 09/12/2020 06:13   CT HEAD WO CONTRAST  Result Date: 09/12/2020 CLINICAL DATA:  Mental status change EXAM: CT HEAD WITHOUT CONTRAST TECHNIQUE: Contiguous axial images were obtained from the base of the skull through the vertex without intravenous contrast. COMPARISON:  09/16/2020 head CT. FINDINGS: Brain: New focus of intraparenchymal hemorrhage involving the left cerebellum and vermis measuring 4.8 x 3.5 x  2.4 cm (5:54, 3:12). Subarachnoid hemorrhage overlies the bilateral cerebellar folia. Subdural hemorrhage also tracks along the inferior tentorium. Intraventricular extension of hemorrhage involving the third and fourth ventricles. New inferior descent of the cerebellar tonsils, concerning for herniation. Complete effacement of the fourth ventricle. New mild lateral and third ventricular dilatation. Diffuse cerebral edema with blurring of the gray-white junctions. No midline shift. There may be a small 3 mm low-density extra-axial collection overlying the right frontal convexity versus artifact (3:19). Vascular: Diffusely increased density of the dural venous sinuses is unchanged. Skull: No acute osseous abnormality. Sinuses/Orbits: Normal orbits. Clear paranasal sinuses. No mastoid effusion. Other: None. IMPRESSION: 4.8 cm left cerebellum/vermis intraparenchymal hemorrhage with third and fourth intraventricular extension. Effacement of the fourth ventricle with new mild lateral/third ventriculomegaly and cerebellar tonsillar descent. Diffuse cerebral edema. 3 mm low-density right frontal extra-axial collection versus artifact. These results were called by telephone at the time of interpretation on 09/12/2020 at 9:05 am to provider GRACE BOWSER , who verbally acknowledged these results. Electronically Signed   By: Stana Bunting M.D.   On: 09/12/2020 09:18   DG CHEST PORT 1 VIEW  Result Date: 09/05/2020 CLINICAL DATA:  Check central line placement EXAM: PORTABLE CHEST 1 VIEW COMPARISON:  Film from earlier in the same day. FINDINGS: Endotracheal tube and gastric catheter are noted in satisfactory position. Right jugular central line is noted with the tip at the cavoatrial junction. No pneumothorax is seen. Extensive aortic stent graft is noted somewhat extended inferiorly when compared with the previous exam. Increased density is noted over the thorax bilaterally consistent with posteriorly layering  effusions. Mild basilar atelectasis is noted stable from the prior CT. IMPRESSION: No pneumothorax following central line placement. Tubes and lines as described. Inferior extension of the aortic stent graft when compared with the prior exam. Bilateral pleural effusions and associated atelectatic changes. Electronically Signed   By: Alcide Clever M.D.   On: 09/07/2020 19:39   DG Abd Portable 1V  Result Date: 09/01/2020 CLINICAL DATA:  NG tube placement EXAM: PORTABLE ABDOMEN - 1 VIEW COMPARISON:  None. FINDINGS: Enteric tube terminates within the stomach. Bowel gas pattern is unremarkable on this single view. A aortic stent graft is seen. IMPRESSION: Enteric tube terminates within the stomach. Electronically Signed   By: Guadlupe Spanish M.D.   On: 09/17/2020 13:22   Overnight EEG with video  Result Date: 09/12/2020 Charlsie Quest, MD     09/12/2020  9:30 AM Patient Name: Glenda Fuller MRN: [id number] Epilepsy Attending: Charlsie Quest Referring Physician/Provider: Graceann Congress, PA Duration: 09/07/2020 2048 to 10/90/21 0900 Patient  history: [age]year old female with history of hypertension who initially presented with back pain and found to have type B descending aortic dissection status post endovascular aorta repair who was noted to have acute onset of agitation, drooling, mild foaming as well as shaking/shivering concerning for seizures.  EEG to evaluate for seizures. Level of alertness: comatose AEDs during EEG study: Keppra Technical aspects: This EEG study was done with scalp electrodes positioned according to the 10-20 International system of electrode placement. Electrical activity was acquired at a sampling rate of 500Hz  and reviewed with a high frequency filter of 70Hz  and a low frequency filter of 1Hz . EEG data were recorded continuously and digitally stored. Description: EEG showed continuous generalized 3 to 6 Hz theta-delta slowing. Around 5am on 09/12/2020 EEG became diffusely attenuated.  Hyperventilation and photic stimulation were not performed.   ABNORMALITY - Continuous slow, generalized - Background attenuation, generalized IMPRESSION: This study was initially suggestive of severe diffuse encephalopathy. After 5am on 10/13/2020, eeg showed profound diffuse encephalopathy nonspecific etiology. No seizures or epileptiform discharges were seen throughout the recording. Charlsie QuestPriyanka O Yadav   CT Angio Chest/Abd/Pel for Dissection W and/or W/WO  Result Date: 08/26/2020 CLINICAL DATA:  None none EXAM: CT ANGIOGRAPHY CHEST, ABDOMEN AND PELVIS TECHNIQUE: Non-contrast CT of the chest was initially obtained. Multidetector CT imaging through the chest, abdomen and pelvis was performed using the standard protocol during bolus administration of intravenous contrast. Multiplanar reconstructed images and MIPs were obtained and reviewed to evaluate the vascular anatomy. CONTRAST:  100mL OMNIPAQUE IOHEXOL 350 MG/ML SOLN COMPARISON:  04-05-2020 FINDINGS: CTA CHEST FINDINGS Cardiovascular: Since the prior examination, stent graft repair of the type B aortic dissection has been performed with a endoluminal stent graft extending from the aortic arch just beyond the takeoff of the left subclavian artery to the diaphragmatic hiatus. The false lumen has thrombosed there is wide patency of the stented true lumen of the descending thoracic aorta. The ascending aorta is unremarkable. Conventional arch vessel anatomy with wide patency of the arch vasculature proximally. Mild cardiomegaly with marked left ventricular hypertrophy again noted. The coronary arteries are widely patent proximally the aortic valve is trileaflet. No pericardial effusion. Central pulmonary arteries are of normal caliber. Mediastinum/Nodes: No pathologic thoracic adenopathy. Thyroid unremarkable. Mediastinal hematoma again noted, grossly stable from prior examination. Lungs/Pleura: Endotracheal tube is seen with its tip at the carina oriented  toward the right mainstem bronchus. Large bilateral pleural effusions are present, enlarged since prior examination with associated complete collapse of the right lower lobe and near complete collapse of the left lower lobe. No pneumothorax. No central obstructing lesion. Musculoskeletal: Epidural catheter is seen with its tip at the level of T7-8. No acute bone abnormality. Review of the MIP images confirms the above findings. CTA ABDOMEN AND PELVIS FINDINGS VASCULAR Aorta: The aortic dissection continues in similar configuration of prior examination with the left gastric artery and right phrenic artery arising from the false lumen, the celiac axis, which is comprised of the common hepatic artery and splenic artery, arising from the true lumen, as well as the superior mesenteric artery, inferior mesenteric artery, and renal arteries bilaterally arising from the true lumen. There is an excrescence of contrast representing a continuation of the false lumen along the left lateral border of the juxtarenal aorta compatible with a contained leak. There is a tiny focus of hyperdensity seen external to the aorta at this level compatible with rupture and extraluminal hemorrhage. This is best seen on axial images 127-132/6. The  dissection terminates within the infrarenal aorta with the common iliac arteries arising from the true lumen. No aneurysm. There is wide patency of the mesenteric and renal vasculature. Celiac: See above. Variant anatomy. Left gastric artery arises from the false lumen. Common hepatic artery and splenic artery form the true celiac axis and arise from the true lumen and are widely patent. SMA: Arises from the true lumen and is widely patent. No evidence of distal embolization. Renals: Arise from the true lumen and are widely patent. IMA: Arise from the true lumen and is widely patent. Inflow: Arise from the true lumen and are widely patent. Internal iliac arteries are patent bilaterally. Veins: Not  well opacified. Review of the MIP images confirms the above findings. NON-VASCULAR Hepatobiliary: Vicarious excretion of contrast within the gallbladder lumen. Liver unremarkable. Pancreas: Not well opacified due to retroperitoneal hematoma and phase of contrast administration. Spleen: Nonenhancing though the splenic artery is patent distally suggesting that this relates to early phase of contrast enhancement. Adrenals/Urinary Tract: As noted previously, the kidneys are markedly atrophic bilaterally. Hyperdensity of the renal cortices represents residual contrast from prior examination and may simply represent physiologic retention though changes of contrast induced nephropathy could appear similarly. No hydronephrosis. The bladder is decompressed with a Foley catheter balloon seen within its lumen. Stomach/Bowel: There is moderate fluid within the abdomen which is uniformly hyperdense and is compatible with moderate hemoperitoneum. The mild ascites was present on prior examination, the increased density and increasing volume of fluid suggests that the hemoperitoneum represents an interval process. Bowel unremarkable. Lymphatic: The retroperitoneal lymph nodes are obscured by an increasing retroperitoneal hemorrhage which now obliterates the fat plane surrounding the abdominal aorta within the retroperitoneum. Reproductive: Unremarkable Other: Increasing diffuse subcutaneous edema is present in keeping with progressive anasarca Musculoskeletal: No acute bone abnormality Review of the MIP images confirms the above findings. IMPRESSION: Interval endovascular repair of a type B aortic dissection within the thorax with thrombosis of the false lumen and wide patency of the stented true lumen. Stable appearance of type B aortic dissection within the abdominal aorta. Evidence of rupture involving the left lateral aspect of the juxtarenal abdominal aorta. Increasing retroperitoneal hemorrhage is present as is new moderate  hemoperitoneum. Marked atrophy of the kidneys again noted. Superimposed cortical hyperdensity represents retained contrast and may be physiologic or the sequela of contrast induced nephropathy. Marked left ventricular hypertrophy again noted. Progressive anasarca now with large bilateral pleural effusions and resultant bibasilar compressive atelectasis and diffuse body wall subcutaneous edema. These results were called by telephone at the time of interpretation on 09/02/2020 at 12:42 pm to provider CHI ELLISON , who verbally acknowledged these results. Electronically Signed   By: Helyn Numbers MD   On: 09/23/2020 12:44   CT HEAD CODE STROKE WO CONTRAST  Result Date: 08/30/2020 CLINICAL DATA:  Code stroke.  Acute neuro deficit.  Aphasia. EXAM: CT HEAD WITHOUT CONTRAST TECHNIQUE: Contiguous axial images were obtained from the base of the skull through the vertex without intravenous contrast. COMPARISON:  None. FINDINGS: Brain: Ventricle size normal.  No acute infarct or mass. There is increased density and thickening of the tentorium bilaterally. The falx also appears hyperdense and mildly thickened. No subarachnoid hemorrhage identified. Vascular: Vessels are diffusely hyperdense including arteries and veins. No asymmetric density of the circle-of-Willis. There is question hypodensity within the transverse and sigmoid sinus bilaterally raising the possibility of venous sinus thrombosis. Skull: Negative Sinuses/Orbits: Paranasal sinuses clear. Mastoid sinus clear. Negative orbit. Other: None ASPECTS (  [place] Stroke Program Early CT Score) - Ganglionic level infarction (caudate, lentiform nuclei, internal capsule, insula, M1-M3 cortex): 7 - Supraganglionic infarction (M4-M6 cortex): 3 Total score (0-10 with 10 being normal): 10 IMPRESSION: 1. Negative for acute infarct. 2. Diffuse increased density of the dural venous sinuses. There is also thickening and increased density of the tentorium bilaterally and the  falx. The patient has a lumbar drain and low intracranial pressure could be the cause of venous congestion. Question hypodensity in the transverse and sigmoid sinus bilaterally which could represent venous sinus thrombosis and venous congestion of the falx and tentorium. 3. ASPECTS is 10 4. These results were called by telephone at the time of interpretation on 09/10/2020 at 12:00 pm to provider Roda Shutters , who verbally acknowledged these results. Electronically Signed   By: Marlan Palau M.D.   On: 09/10/2020 12:00   HYBRID OR IMAGING ([facility] ONLY)  Result Date: 09/03/2020 There is no interpretation for this exam.  This order is for images obtained during a surgical procedure.  Please See "Surgeries" Tab for more information regarding the procedure.    PHYSICAL EXAM  Temp:  [92.2 F (33.4 C)-98.1 F (36.7 C)] 98.1 F (36.7 C) (10/19 0800) Pulse Rate:  [52-95] 88 (10/19 1130) Resp:  [9-30] 26 (10/19 1130) BP: (90-150)/(56-93) 150/93 (10/19 0830) SpO2:  [93 %-100 %] 100 % (10/19 1130) Arterial Line BP: (83-155)/(38-88) 151/88 (10/19 1130) FiO2 (%):  [40 %-100 %] 40 % (10/19 1100) Weight:  [48.9 kg] 48.9 kg (10/19 0500)  General - Well nourished, well developed, unresponsive  Ophthalmologic - fundi not visualized due to noncooperation.  Cardiovascular - Regular rhythm and rate.  Neuro - intubated on sedation, eyes closed, not following commands. With forced eye opening, eyes in mid position, not blinking to visual threat, doll's eyes absent, not tracking, pupils 35mm dilated and fixed, no papillary reflex. Corneal reflex absent, gag and cough absent. Breathing not over the vent.  Facial symmetry not able to test due to ET tube.  Tongue protrusion not cooperative. On pain stimulation, no movement of all extremities. DTR absent and no babinski. Sensation and coordination not tested.   ASSESSMENT/PLAN Glenda Fuller is a 34 y.o. female with history of HTN presenting with back pain and anemia,  found to have type B descending aortic dissection. S/p endovascular aorta repair.    Intracranial hypotension s/p lumbar drain Cerebellar ICH with IVH  10/18 Acute onset agitation combative in the setting with hypoxia, hypotension and hypoglycemia  Also reports of drooling, mouth foaming, "shaking vs. Shivering"  LTM EEG flat, continuous slowing, background attenuation, no seizure   CT head 10/18 no acute infarct, venous congestion, ASPECTS 10.   Bloody lumbar drain 10/18 - 10/19  Repeat CT head 10/19 - new L cerebellar /vermis IPH w/ 3rd and 4th IVH. Effacement 4th ventricle w/ ventriculomegaly and cerebellar tonsilar descent.    ICH/IVH more likely due to venous engorgement from intracranial hypotension  Clinically no brainstem reflexes  Poor prognosis, discussed with Dr. Everardo All  Cerebral edema Obstructive hydrocephalus  CT head 10/19 showed diffuse cerebral edema. Obstructive hydrocephalus also seen  Clinically no brainstem reflexes  Poor prognosis, discussed with Dr. 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 Rupture L lateral aspect juxtorenal abd aorta w/ retroperitoneal hemorrhage and new moderate hemoperitoneum. Atrophied kidneys. Marked LVH. Progressive anasarca w/ large B pleural effusions and bibasilar compressive atx and wall SubQ edema.   10/18 S/p endovascular stent graft of supra-renalabdominal aorta and Exploratory laparotomy for  decompression of abdominal compartment syndrome.  VVS on board  Hypertensive emergency Hypotension  Home meds:  none  Was on cleviprex and esmolol drip   Was on norvasc, coreg, hydralazine po  TTE suggested severe LVH  Hypotensive overnight on maximized dose of levophed  Now BP goal 140-150 - on levophed  Vanc/Cefepime 10/19>>  AKI  Cre 10.12->8.29->8.13->7.7->7.63->5.47  On IVF  On CRRT  Nephrology on board  Anemia   Hb 5.4-> transfusion->10.2->12.7->11.6->14.1->11.7  Other Stroke Risk Factors  Hx ETOH use  Hx Substance abuse  Other Active Problems    Hospital day # 3  This patient is critically ill due to ICH, IVH, cerebral edema, hydrocephalus, aortic dissection s/p repair x 2, spinal cord infarct, severe anemia, AKI on CRRT and at significant risk of neurological worsening, death form brain herniation, obstructive hydrocephalus, renal failure, sepsis, shock. This patient's care requires constant monitoring of vital signs, hemodynamics, respiratory and cardiac monitoring, review of multiple databases, neurological assessment, discussion with family, other specialists and medical decision making of high complexity. I spent 35 minutes of neurocritical care time in the care of this patient. I discussed with CCM Dr. Haynes Kerns and NP Delorise Shiner.  Marvel Plan, MD PhD Stroke Neurology 09/12/2020 11:40 AM   To contact Stroke Continuity provider, please refer to [email or web address]. After hours, contact General Neurology

## 2020-09-12 NOTE — Progress Notes (Signed)
225 mL fentanyl wasted in stericycle with Misha Ferrolito RN as witness.

## 2020-09-12 NOTE — Progress Notes (Signed)
eLink Physician-Brief Progress Note Patient Name: Glenda Fuller DOB: 1986/10/12 MRN: [id number]   Date of Service  09/12/2020  HPI/Events of Note  Hyperglycemia - Blood glucose = 317  eICU Interventions  Plan: 1. D/C D10W IV infusion. 2. Q 4 hour very sensitive Novolog SSI coverage.      Intervention Category Major Interventions: Hyperglycemia - active titration of insulin therapy  Lenell Antu 09/12/2020, 9:27 PM

## 2020-09-12 NOTE — Progress Notes (Signed)
   09/12/20 0830  Clinical Encounter Type  Visited With Family  Visit Type Follow-up  Referral From Nurse  Consult/Referral To Chaplain  Chaplain called to support the patient's family in the waiting area. Patient's fiance, Justice and sister, [place] sitting in waiting area. Brittany crying, stated the doctor gave bad news about the patient's condition. [place] is not accepting that the patient may not survive. Chaplain offered prayer and comfort.This note was prepared by Deneen Harts, M.Div..  For questions please contact by phone [phone].

## 2020-09-12 NOTE — Progress Notes (Signed)
eLink Physician-Brief Progress Note Patient Name: Glenda Fuller DOB: 21-Apr-1986 MRN: [id number]   Date of Service  09/12/2020  HPI/Events of Note  Asking for CxR/KUB for tube placement/displacement  confirmation.   eICU Interventions  Ordered both     Intervention Category Minor Interventions: Other:  Ranee Gosselin 09/12/2020, 3:46 AM

## 2020-09-12 NOTE — Progress Notes (Signed)
eLink Physician-Brief Progress Note Patient Name: Glenda Fuller DOB: 11-10-1986 MRN: [id number]   Date of Service  09/12/2020  HPI/Events of Note  CDS says patient is a donor candidate. Requests MAP > 70 and AM  Labs.   eICU Interventions  Plan.  1. Norepinephrine IV infusion. Titrate for MAP > 70. 2. ABG, CMP and Serum Osm in AM.     Intervention Category Major Interventions: Other:  Lenell Antu 09/12/2020, 11:57 PM

## 2020-09-12 NOTE — Progress Notes (Addendum)
PCCM Progress Note  Family meeting has occurred in setting of CT H results. Dr. Everardo All (PCCM) discussed these devastating findings and plan for family members to arrive for final goodbyes.   I have spoken with numerous friends and family members since to help facilitate visitation. (3 at a time, rotating)  Family wishes for DNR status, and for transition to comfort care after family/friends can say goodbye. We discussed possibility that pt may die prior to this transition, and is understood.  Family emphasizes wish for no suffering and expresses gratitude for all who have helped care for Glenda Fuller.   P -DNR -no further labs, imaging etc -no CRRT -continue pressors, Vent until family ready for compassionate extubation and transition to comfort care.  -Fent gtt   CCT >100 minutes  Tessie Fass MSN, AGACNP-BC [place] [facility] [phone] If no answer, [phone] 09/12/2020, 12:12 PM

## 2020-09-12 NOTE — Progress Notes (Addendum)
Progress Note    09/12/2020 7:06 AM 1 Day Post-Op  Subjective:  Intubated/sedated  Per RN, lumbar drain with pink tinged drainage  HR 50's-90's NSR 110's-130's systolic on Levophed 97% .[id number]   Vitals:   09/12/20 0630 09/12/20 0700  BP: 121/71   Pulse: 89 88  Resp: (!) 27 (!) 26  Temp:    SpO2: 99% 98%    Physical Exam: Cardiac:  NSR Lungs:  Intubated on .09XIP3  Incisions:  Right groin with temp cath in place; left groin without hematoma; laparotomy with wound vac with good seal.   Extremities:  Brisk biphasic DP doppler signals bilaterally Abdomen:  Mild distension with wound vac in place  CBC    Component Value Date/Time   WBC 4.2 09/12/2020 0500   RBC 4.32 09/12/2020 0500   HGB 11.7 (L) 09/12/2020 0500   HCT 34.8 (L) 09/12/2020 0500   PLT 243 09/12/2020 0500   MCV 80.6 09/12/2020 0500   MCH 27.1 09/12/2020 0500   MCHC 33.6 09/12/2020 0500   RDW 22.8 (H) 09/12/2020 0500    BMET    Component Value Date/Time   NA 131 (L) Sep 25, 2020 1835   K 3.1 (L) 09/25/20 1835   CL 98 September 25, 2020 1834   CO2 12 (L) 09-25-2020 1834   GLUCOSE 70 09-25-20 1834   BUN 94 (H) 25-Sep-2020 1834   CREATININE 7.63 (H) 09-25-2020 1834   CALCIUM 5.7 (LL) September 25, 2020 1834   GFRNONAA 6 (L) 2020-09-25 1834    INR    Component Value Date/Time   INR 1.3 (H) 09/10/2020 0210     Intake/Output Summary (Last 24 hours) at 09/12/2020 0706 Last data filed at 09/12/2020 0700 Gross per 24 hour  Intake 2821.43 ml  Output 2255 ml  Net 566.43 ml     Assessment:  34 y.o. female is s/p:  1.  Ultrasound-guided access of right common femoral artery with percutaneous closure for delivery of endografty 2.  Intravascular ultrasound (IVUS) of right iliac artery, abdominal aorta, descending thoracic aorta, aortic arch, and ascending aorta 3.  Thoracic aortogram 4.  Endovascular stent graft of descending thoracic aortic dissection without coverage of the left subclavian artery (31  mm x 31 mm x 20 cm Gore thoracic endograft) 5.  Right iliac arteriogram Post-Op 2 and 1.  Ultrasound-guided access of left common femoral artery with percutaneous closure for delivery of endograft 2.  Intravascular ultrasound (IVUS) of infrarenal abdominal aorta, suprarenal abdominal aorta, and distal thoracic aorta 3.  Abdominal aortogram 4.  Endovascular stent graft of supra-renal abdominal aorta including coverage of the celiac artery (31 mm x 31 mm x 10 cm Gore thoracic endograft) 5.  Exploratory laparotomy for decompression of abdominal compartment syndrome 6.  Ultrasound-guided access of right common femoral vein for placement of dialysis catheter  1 Day Post-Op  Plan: Vascular:  pt with brisk DP doppler signals bilaterally;  Stent graft extended down to just above SMA.  No evidence of rupture intraoperatively.  Laparotomy performed for abdominal compartment syndrome as her abdomen was distended with ascites.   Pulmonary: intubated/sedated on .40FiO2  97% Neuro:  Sedated; lumbar drain in place for paraplegia.  Now with pink tinged fluid as of yesterday-will contact anesthesia to check drain.   Cardiac:  NSR; requiring pressors;  Intra-op TEE revealed severe LVH  Renal:  ARF now on CRRT Heme/ID:  Acute blood loss anemia is stable with hgb of 11.7.  WBC  Normal.     Doreatha Massed, PA-C [facility] [phone] 09/12/2020 7:06 AM  I have seen and evaluated the patient. I agree with the PA note as documented above.  34 year old female remains critically ill after presenting with type B dissection over the weekend with concern for contained rupture in the chest and acute renal failure.  She underwent descending thoracic stent placement on Saturday night.  She was making progress except for lower extremity weakness and had spinal drain placed Sunday night.  Decompensated yesterday and taken back for abdominal aortogram with additional suprarenal stent.  I do not think  she was ruptured.  Decompressive laparotomy was also performed with just serous fluid.  Remains on high-dose Levophed this morning at 32 mcg.  Has good Doppler flow in both feet in the DP.  Both groins are clean dry and intact.  She has a dialysis catheter in the right groin that was placed in the OR.  Abdominal VAC with good seal.  Appreciate critical care management.  Have discussed with family I think she will be on dialysis and likely paralyzed if she survives.  Cephus Shelling, MD [facility] [place] Office: [phone]

## 2020-09-12 NOTE — Progress Notes (Signed)
RT note. RT pushed ETT to 23cm at lip due to notable cuff leak, leak now non detectable, insp. volumes matching exp. Volumes. Pt still producing "neuro breath" about every fourth breaths, making pt. Look dyschronous from vent occasionally. Pt. On continuous EEG

## 2020-09-13 ENCOUNTER — Inpatient Hospital Stay (HOSPITAL_COMMUNITY): Payer: Self-pay | Admitting: Anesthesiology

## 2020-09-13 ENCOUNTER — Encounter (HOSPITAL_COMMUNITY): Admission: EM | Disposition: E | Payer: Self-pay | Source: Home / Self Care | Attending: Pulmonary Disease

## 2020-09-13 ENCOUNTER — Encounter (HOSPITAL_COMMUNITY): Payer: Self-pay | Admitting: Anesthesiology

## 2020-09-13 DIAGNOSIS — I614 Nontraumatic intracerebral hemorrhage in cerebellum: Secondary | ICD-10-CM

## 2020-09-13 DIAGNOSIS — I619 Nontraumatic intracerebral hemorrhage, unspecified: Secondary | ICD-10-CM

## 2020-09-13 LAB — POCT I-STAT 7, (LYTES, BLD GAS, ICA,H+H)
Acid-base deficit: 6 mmol/L — ABNORMAL HIGH (ref 0.0–2.0)
Acid-base deficit: 3 mmol/L — ABNORMAL HIGH (ref 0.0–2.0)
Bicarbonate: 16.8 mmol/L — ABNORMAL LOW (ref 20.0–28.0)
Bicarbonate: 19.8 mmol/L — ABNORMAL LOW (ref 20.0–28.0)
Calcium, Ion: 0.96 mmol/L — ABNORMAL LOW (ref 1.15–1.40)
Calcium, Ion: 0.95 mmol/L — ABNORMAL LOW (ref 1.15–1.40)
HCT: 35 % — ABNORMAL LOW (ref 36.0–46.0)
HCT: 34 % — ABNORMAL LOW (ref 36.0–46.0)
Hemoglobin: 11.9 g/dL — ABNORMAL LOW (ref 12.0–15.0)
Hemoglobin: 11.6 g/dL — ABNORMAL LOW (ref 12.0–15.0)
O2 Saturation: 95 %
O2 Saturation: 96 %
Patient temperature: 94.3
Patient temperature: 95.4
Potassium: 4.2 mmol/L (ref 3.5–5.1)
Potassium: 4.2 mmol/L (ref 3.5–5.1)
Sodium: 126 mmol/L — ABNORMAL LOW (ref 135–145)
Sodium: 127 mmol/L — ABNORMAL LOW (ref 135–145)
TCO2: 18 mmol/L — ABNORMAL LOW (ref 22–32)
TCO2: 21 mmol/L — ABNORMAL LOW (ref 22–32)
pCO2 arterial: 23.4 mmHg — ABNORMAL LOW (ref 32.0–48.0)
pCO2 arterial: 27.3 mmHg — ABNORMAL LOW (ref 32.0–48.0)
pH, Arterial: 7.455 — ABNORMAL HIGH (ref 7.350–7.450)
pH, Arterial: 7.462 — ABNORMAL HIGH (ref 7.350–7.450)
pO2, Arterial: 63 mmHg — ABNORMAL LOW (ref 83.0–108.0)
pO2, Arterial: 68 mmHg — ABNORMAL LOW (ref 83.0–108.0)

## 2020-09-13 LAB — TYPE AND SCREEN
ABO/RH(D): B POS
Antibody Screen: NEGATIVE
Unit division: 0
Unit division: 0
Unit division: 0
Unit division: 0
Unit division: 0
Unit division: 0
Unit division: 0
Unit division: 0
Unit division: 0
Unit division: 0
Unit division: 0
Unit division: 0

## 2020-09-13 LAB — GLUCOSE, CAPILLARY
Glucose-Capillary: 331 mg/dL — ABNORMAL HIGH (ref 70–99)
Glucose-Capillary: 244 mg/dL — ABNORMAL HIGH (ref 70–99)
Glucose-Capillary: 162 mg/dL — ABNORMAL HIGH (ref 70–99)
Glucose-Capillary: 147 mg/dL — ABNORMAL HIGH (ref 70–99)
Glucose-Capillary: 90 mg/dL (ref 70–99)

## 2020-09-13 LAB — BPAM RBC
Blood Product Expiration Date: 202110302359
Blood Product Expiration Date: 202110302359
Blood Product Expiration Date: 202111102359
Blood Product Expiration Date: 202111102359
Blood Product Expiration Date: 202111102359
Blood Product Expiration Date: 202111102359
Blood Product Expiration Date: 202111102359
Blood Product Expiration Date: 202111102359
Blood Product Expiration Date: 202111112359
Blood Product Expiration Date: 202111112359
Blood Product Expiration Date: 202111112359
Blood Product Expiration Date: 202111122359
ISSUE DATE / TIME: 202110162133
ISSUE DATE / TIME: 202110162133
ISSUE DATE / TIME: 202110162207
ISSUE DATE / TIME: 202110162207
ISSUE DATE / TIME: 202110171219
ISSUE DATE / TIME: 202110181150
ISSUE DATE / TIME: 202110181244
ISSUE DATE / TIME: 202110181244
ISSUE DATE / TIME: 202110181741
ISSUE DATE / TIME: 202110182008
Unit Type and Rh: 7300
Unit Type and Rh: 7300
Unit Type and Rh: 7300
Unit Type and Rh: 7300
Unit Type and Rh: 7300
Unit Type and Rh: 7300
Unit Type and Rh: 7300
Unit Type and Rh: 7300
Unit Type and Rh: 7300
Unit Type and Rh: 7300
Unit Type and Rh: 9500
Unit Type and Rh: 9500

## 2020-09-13 LAB — COMPREHENSIVE METABOLIC PANEL
ALT: 10 U/L (ref 0–44)
AST: 40 U/L (ref 15–41)
Albumin: 1.2 g/dL — ABNORMAL LOW (ref 3.5–5.0)
Alkaline Phosphatase: 101 U/L (ref 38–126)
Anion gap: 15 (ref 5–15)
BUN: 58 mg/dL — ABNORMAL HIGH (ref 6–20)
CO2: 16 mmol/L — ABNORMAL LOW (ref 22–32)
Calcium: 6.4 mg/dL — CL (ref 8.9–10.3)
Chloride: 94 mmol/L — ABNORMAL LOW (ref 98–111)
Creatinine, Ser: 5.06 mg/dL — ABNORMAL HIGH (ref 0.44–1.00)
GFR, Estimated: 10 mL/min — ABNORMAL LOW (ref >60)
Glucose, Bld: 225 mg/dL — ABNORMAL HIGH (ref 70–99)
Potassium: 4.2 mmol/L (ref 3.5–5.1)
Sodium: 125 mmol/L — ABNORMAL LOW (ref 135–145)
Total Bilirubin: 0.5 mg/dL (ref 0.3–1.2)
Total Protein: 3.8 g/dL — ABNORMAL LOW (ref 6.5–8.1)

## 2020-09-13 LAB — RESPIRATORY PANEL BY RT PCR (FLU A&B, COVID)
Influenza A by PCR: NEGATIVE
Influenza B by PCR: NEGATIVE
SARS Coronavirus 2 by RT PCR: NEGATIVE

## 2020-09-13 LAB — OSMOLALITY: Osmolality: 292 mOsm/kg (ref 275–295)

## 2020-09-13 LAB — PATHOLOGIST SMEAR REVIEW: Path Review: INCREASED

## 2020-09-13 SURGERY — SURGICAL PROCUREMENT, ORGAN
Anesthesia: General

## 2020-09-13 MED ORDER — GLYCOPYRROLATE 0.2 MG/ML IJ SOLN: 0.2000 mg | INTRAMUSCULAR | Status: DC | PRN

## 2020-09-13 MED ORDER — CALCIUM GLUCONATE-NACL 1-0.675 GM/50ML-% IV SOLN
1.0000 g | Freq: Once | INTRAVENOUS | Status: AC
  Administered 2020-09-13: 1000 mg via INTRAVENOUS

## 2020-09-13 MED ORDER — ACETAMINOPHEN 325 MG PO TABS: 650.0000 mg | ORAL_TABLET | Freq: Four times a day (QID) | ORAL | Status: DC | PRN

## 2020-09-13 MED ORDER — GLYCOPYRROLATE 1 MG PO TABS
1.0000 mg | ORAL_TABLET | ORAL | Status: DC | PRN
  Filled 2020-09-13: qty 1

## 2020-09-13 MED ORDER — INSULIN ASPART 100 UNIT/ML ~~LOC~~ SOLN
0.0000 [IU] | SUBCUTANEOUS | Status: DC
  Administered 2020-09-13: 4 [IU] via SUBCUTANEOUS
  Administered 2020-09-13: 2 [IU] via SUBCUTANEOUS

## 2020-09-13 MED ORDER — ACETAMINOPHEN 650 MG RE SUPP: 650.0000 mg | Freq: Four times a day (QID) | RECTAL | Status: DC | PRN

## 2020-09-13 MED ORDER — STERILE WATER FOR INJECTION IV SOLN
INTRAVENOUS | Status: DC
  Filled 2020-09-13 (×2): qty 850

## 2020-09-13 MED ORDER — DIPHENHYDRAMINE HCL 50 MG/ML IJ SOLN: 25.0000 mg | INTRAMUSCULAR | Status: DC | PRN

## 2020-09-13 MED ORDER — MORPHINE 100MG IN NS 100ML (1MG/ML) PREMIX INFUSION
1.0000 mg/h | INTRAVENOUS | Status: DC
  Administered 2020-09-13: 1 mg/h via INTRAVENOUS
  Filled 2020-09-13: qty 100

## 2020-09-13 MED ORDER — POLYVINYL ALCOHOL 1.4 % OP SOLN
1.0000 [drp] | Freq: Four times a day (QID) | OPHTHALMIC | Status: DC | PRN
  Filled 2020-09-13: qty 15

## 2020-09-14 LAB — URINE CULTURE: Culture: NO GROWTH

## 2020-09-14 LAB — HEMOGLOBIN A1C
Hgb A1c MFr Bld: 5.5 % (ref 4.8–5.6)
Mean Plasma Glucose: 111 mg/dL

## 2020-09-15 ENCOUNTER — Encounter (HOSPITAL_COMMUNITY): Payer: Self-pay

## 2020-09-17 LAB — CULTURE, BLOOD (ROUTINE X 2)
Culture: NO GROWTH
Culture: NO GROWTH

## 2020-09-18 ENCOUNTER — Encounter (HOSPITAL_COMMUNITY): Payer: Self-pay | Admitting: Vascular Surgery

## 2020-09-18 DIAGNOSIS — I10 Essential (primary) hypertension: Secondary | ICD-10-CM

## 2020-09-19 LAB — URINE DRUGS OF ABUSE SCREEN W ALC, ROUTINE (REF LAB)
Amphetamines, Urine: NEGATIVE ng/mL
Barbiturate, Ur: NEGATIVE ng/mL
Benzodiazepine Quant, Ur: NEGATIVE ng/mL
Cocaine (Metab.): NEGATIVE ng/mL
Ethanol U, Quan: NEGATIVE %
Methadone Screen, Urine: NEGATIVE ng/mL
Opiate Quant, Ur: NEGATIVE ng/mL
Phencyclidine, Ur: NEGATIVE ng/mL
Propoxyphene, Urine: NEGATIVE ng/mL

## 2020-09-19 LAB — PANEL 799049
CARBOXY THC GC/MS CONF: 10 ng/mL
Cannabinoid GC/MS, Ur: POSITIVE — AB

## 2020-09-25 NOTE — Plan of Care (Addendum)
Unfortunate case, plan for comfort care and organ donation noted. Please call if any questions.  Marvel Plan, MD PhD Stroke Neurology 27-Sep-2020 2:11 PM

## 2020-09-25 NOTE — Procedures (Signed)
Extubation Procedure Note  Patient Details:   Name: Glenda Fuller DOB: [birth year] MRN: [id number]   Airway Documentation:    Vent end date: 08/27/2020 Vent end time: 1747   Evaluation  O2 sats: currently acceptable Complications: Complications of extubation to comfort care. Patient did tolerate procedure well. Bilateral Breath Sounds: Clear, Diminished   No   Patient extubated per order and family wishes to comfort care. RN and family are at bedside. RT will continue to monitor.   Edwardo Wojnarowski Lajuana Ripple 09/09/2020, 5:48 PM

## 2020-09-25 NOTE — Anesthesia Postprocedure Evaluation (Signed)
Anesthesia Post Note  Patient: Investment banker, corporate  Procedure(s) Performed: abdominal AORTOGRAM (N/A Abdomen) ABDOMINAL AORTIC ENDOVASCULAR STENT GRAFT insertion (N/A Abdomen) ULTRASOUND GUIDANCE FOR VASCULAR ACCESS, left femoral artery (Left Groin) INTRAVASCULAR ULTRASOUND, abdominal and thoracic aorta (N/A Groin) EXPLORATORY LAPAROTOMY (N/A Abdomen) APPLICATION OF abdominal WOUND VAC (N/A Abdomen) INSERTION OF  POWER TRIALYSIS SHORT-TERM STRAIGHT DIALYSIS CATHETER INTO RIGHT GROIN (Right Groin)     Patient location during evaluation: ICU Anesthesia Type: General Level of consciousness: patient remains intubated per anesthesia plan Pain management: pain level controlled Vital Signs Assessment: post-procedure vital signs reviewed and stable Respiratory status: patient remains intubated per anesthesia plan Cardiovascular status: stable Postop Assessment: no apparent nausea or vomiting Anesthetic complications: no   No complications documented.  Last Vitals:  Vitals:   08/25/2020 1747 08/27/2020 1800  BP:    Pulse:    Resp:  (!) 0  Temp:    SpO2: 92%     Last Pain:  Vitals:   09/15/2020 1430  TempSrc: Oral  PainSc:                  Glenda Fuller

## 2020-09-25 NOTE — Progress Notes (Signed)
RN notified by [facility] that patient was not a candidate for organ donation. CCM notified. Talked with family.

## 2020-09-25 NOTE — Progress Notes (Signed)
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  Underwent stent grafting Saturday night.  Ultimately she decompensated on Monday morning and concern for rupture in the abdomen distally.  We took her back and did additional stent graft with coverage of the celiac down to the takeoff of the SMA.  No evidence of rupture.  Ultimately she had blown pupils yesterday and found to have significant head bleed that was not survivable.  Really appreciate critical care and neurology input.  Came by to give my condolences to family.  Discussed with [place] donor services and appreciate them as well.  Transition to comfort care as discussed with critical care yesterday.  Plan organ donation today.  Cephus Shelling, MD [facility] [place] Office: [phone]   Cephus Shelling

## 2020-09-25 NOTE — Progress Notes (Signed)
Patient compassionately extubated at 1747 with family at bedside. Vasopressors discontinued. Morphine drip infusing for comfort. Patient expired at 1800, pronounced by Angela Burke, RN and Alfredo Batty, RN. Raymon Mutton NP notified who will relay the message to Dr. Mechele Collin. [facility] calling [facility].

## 2020-09-25 NOTE — Progress Notes (Signed)
Lumbar drain in place.  Pt now declared brain-dead.  Comfort care only.  Will leave drain in place.

## 2020-09-25 NOTE — Progress Notes (Signed)
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 Pt initially presented to UC for worsening back pain, fatigue and SOB. Back pain began 2 weeks ago while pushing shopping cart, throbbing in nature and initially worsened with movement and improved with rest. 10/16 pain has moved, extending up the mid back. Labs revealed significant anemia as well as significant AKI. A CT renal stone study was obtained which was concerning for possible aortic dissection.CTA chest and pelvis revealed type B aortic dissection with rupture and mediastinal hematoma.  Pt BP 228/116 on presentation to [facility]. Started on esmolol and clevidipine infusions and is to be transferred to [facility] ED for admission to ICU.   Significant [facility] labs:  Hgb 5.4 HCT 16.1, hsTrop 338 Na 131 K 3.3 BUN 118 Cr 10.12 AG 20  Past Medical History  HTN  Significant Hospital Events   10/16 Referred to [facility] ED from UC for anemia. Found to have type B aortic dissection. On Esmolol and clevidipine for SBP > 200.  10/17 POD0 TEVAR 10/18 new onset paraparesis-- incr SBPgoal to <140. Spinal drain placed by anesthesia.  10/19 Developed dilated and fixed pupils on morning assessment, STAT head CT revealed left cerebellar ICH with IVH, hydrocephalus and intracranial hypotension. Patient was made  DNR and CDS was involved  10/20 Plan for organ donation   Consults:  Vascular surgery  Anesthesia Neurology  Procedures:  10/17 Art line> Procedure: 1.  Ultrasound-guided access of right common femoral artery with percutaneous closure for delivery of endografty 2.   Intravascular ultrasound (IVUS) of right iliac artery, abdominal aorta, descending thoracic aorta, aortic arch, and ascending aorta 3.  Thoracic aortogram 4.  Endovascular stent graft of descending thoracic aortic dissection without coverage of the left subclavian artery (31 mm x 31 mm x 20 cm Gore thoracic endograft) 5.  Right iliac arteriogram  10/18 spinal drain > 10/18 ETT> 10/18 CVC> 10/18 HD cath>  10/18> abdominal aortagram. Stent graft extension to cover celiac artery, ex lap decompression of abdomen   Significant Diagnostic Tests:  CT renal stone study> suspected aortic dissection originating in chest witn mediastinal hematoma. Hypertrophic LV. 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 Small bilateral pleural effusion, mild pulmonary edema  CTA c/a/p> Moderate hematoma extending from isthmus of aorta nearly to diaphragm. LV hypertrophy. Aortic Dissection beginning at isthmus of aorta and continuing into abdomen. False lumen compresses true lumen. Dissection terminates at livel of aortic bifurcation. True lumen serves celiac axis, SMA, bilateral renal arteries   10/18 CT H non-con> no acute infarct. Incr density of dural venous sinuses, thickening and incr density of tentorium bilaterally and falx. Possible hypodenisty in transverse and sigmoid sinus bilaterally   10/18 CTA c/a/p> stable stent graft placement. Possible rupture in visceral segment distal to stent graft with possible rbp.   10/19 Head CT > 4.8 cm left cerebellum/vermis intraparenchymal hemorrhage with third and fourth intraventricular extension. Effacement of the fourth ventricle with new mild lateral/third ventriculomegaly and cerebellar tonsillar descent. Diffuse cerebral edema.  Micro Data:  SARS Cov2> neg  Flu A / B> neg  Antimicrobials:    Interim history/subjective:  Pressor support started overnight for MAP goal > 70  Objective   Blood pressure 105/75, pulse (!) 46, temperature 97.7 F  (36.5 C), resp. rate 17, height 5' (1.524 m), weight 44.9 kg, last menstrual period 07/30/2020, SpO2 94 %.        Intake/Output Summary (Last 24 hours) at 09/10/2020 1100 Last data filed at 09/10/2020 0801 Gross per 24 hour  Intake 4458.79 ml  Output 155 ml  Net 4303.79 ml   Filed Weights   08/29/2020 1508 09/10/20 0322  Weight: 39.1 kg 44.9 kg    Examination:  General: Acute ill appearing young female lying in bed on mechanical ventilation, in NAD HEENT: ETT, MM pink/moist, pupils are dilated and fixed,  Neuro: Faint flicker of movement to painful stimuli in upper extremities  CV: s1s2 regular rate and rhythm, no murmur, rubs, or gallops,  PULM: Clear breath sounds bilaterally, no increased work of breathing, tolerating ventilator GI: soft, bowel sounds active in all 4 quadrants, non-tender, non-distended Extremities: warm/dry, no edema  Skin: no rashes or lesions  Resolved Hospital Problem list     Assessment & Plan:  Glenda Fuller is a 34 y.o. who was referred to [facility] ED from UC for anemia. Found to have type B aortic dissection. On Esmolol and clevidipine for SBP > 200. On 10/17 She underwent TEVAR. The following day she developed new onset paraparesis-- incr SBPgoal to <140 and a spinal drain was  placed by anesthesia. On 10/19 she developed dilated and fixed pupils on morning assessment this prompted a STAT head CT revealed left cerebellar ICH with IVH, hydrocephalus and intracranial hypotension. Patient was made  DNR and CDS was involved  10/20 Plan for organ donation    During admission patient was seen for the following diagnoses  Left cerebellar ICH with IVH, hydrocephalus and intracranial hypotension -Developed dilated and fixed pupils on morning assessment, STAT head CT revealed ICH with IVH, hydrocephalus  Paraparesis -suspect due to spinal vascular compromise.  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 Chestine Spore on 09/10/20 -return to OR 10/18 for abd aortagram which did not reveal rupture. Stent graft extended to include coverage of celiac artery. Shock -appears volume up, Hgb stable-- doubt hemorrhage or hypovolemia. Favor neuro vs cardiogenic. Possibly septic but less likely Hypertensive Emergency Acute renal failure Abdominal distension -s/p decompressive laparotomy 10/18 with VVS with evac of lg. Volume serous fluid ABLA -from mediastinal hematoma improved after transfusion Elevated triglycerides -high doses of clevidipine for HTN control Hypoglycemia   Plan: Given presence of devastating brain bleed patient has been made a DO NOT RESUSCITATE.  CDS is following for possible organ donation.  If unable to pursue organ donation.  PCCM team will pursue compassionate extubation with family present.  Until organ donation availability has been determined we will continue ventilator support and a low-dose of pressor support.  Best practice:  Diet: NPO Pain/Anxiety/Delirium protocol (if indicated): weaning fentanyl  VAP protocol (if indicated): na DVT prophylaxis: SQ Heparin and baby ASA per VVS  GI prophylaxis: na Glucose control: monitor Mobility: BR Code Status: DNR Family Communication: Fiance and sister [place] at bedside 10/19 AM.  Disposition: ICU   CRITICAL CARE Performed by: Delfin Gant  Total critical care time: 38 minutes  Critical care time was exclusive of  separately billable procedures and treating other patients.  Critical care was necessary to treat or prevent imminent or life-threatening deterioration.  Critical care was time spent personally by me on the following activities: development of treatment plan with patient and/or surrogate as well as nursing, discussions with consultants, evaluation of patient's response to treatment, examination of  patient, obtaining history from patient or surrogate, ordering and performing treatments and interventions, ordering and review of laboratory studies, ordering and review of radiographic studies, pulse oximetry and re-evaluation of patient's condition.  Delfin Gant, NP-C [facility] Contact / Pager information can be found on Amion  04-Oct-2020, 9:52 AM

## 2020-09-25 NOTE — Death Summary Note (Signed)
DEATH SUMMARY   Patient Details  Name: Glenda Fuller MRN: [id number] DOB: July 31, 1986  Admission/Discharge Information   Admit Date:  09-21-2020  Date of Death: Date of Death: 2020-09-25  Time of Death: Time of Death: 1800  Length of Stay: 4  Referring Physician: Patient, No Pcp Per   Reason(s) for Hospitalization  Acute blood loss anemia secondary to aortic dissection  Diagnoses  Preliminary cause of death: Left cerebellar intraparenchymal hemorrhage secondary to hypertensive emergency Secondary Diagnoses (including complications and co-morbidities): Aortic dissection, acute renal failure secondary to hypertension Active Problems:   Symptomatic anemia   Thoracoabdominal aortic dissection (HCC)   Renal failure   S/P insertion of endovascular thoracic aortic stent graft   Spinal cord infarction [facility])   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 year old female with hypertension who presented with back pain and found with acute blood loss anemia secondary to type B aortic dissection with hematoma confirmed on CTA. Vascular surgery was consulted and patient underwent aortogram and endovascular stent graft of descending thoracic aortic dissection. Post-op she was extubated and sent to the ICU for anti-hypertensive management. On 09/12/2020 she had worsening weakness of the lower extremities. Spinal drain placed with some improvement of weakness however later in the day she had abrupt witnessed change in mental status associated with seizure-like activity as well as sudden onset of hypotension. Due to her critical state she was intubated and underwent STAT CTA which suggested possible aortic rupture distal to the graft. She urgently went to the OR which did not demonstrate active extravasation. Stent  graft was extended and decompressive lap was performed for possible abdominal compartment syndrome. Post-op she remained intubated and was started on renal replacement therapy. On 09/12/20 patient's neuro exam demonstrated dilated fixed pupils. CT head showed new left intraparenchymal hemorrhage with intraventricular extension, ventriculomegaly and cerebral edema and cerebellar tonsillar descent. Primary team discussed with family regarding her poor prognosis and decision was made to withdraw care. Family wished to have patient be evaluated for organ transplant however was not deemed a candidate. Patient was compassionately extubated on 25-Sep-2020 and expired at 1800.    Pertinent Labs and Studies  Significant Diagnostic Studies DG Chest 1 View  Result Date: 09/12/2020 CLINICAL DATA:  Endotracheal and OG tube placements. History of thoracic aortic dissection. EXAM: CHEST  1 VIEW COMPARISON:  08/25/2020 FINDINGS: Endotracheal and enteric tubes as well as right central venous catheter are unchanged in position. Descending aortic stent graft. Cardiac enlargement. Probable small bilateral pleural effusions with basilar atelectasis or infiltration. Similar appearance to previous study. IMPRESSION: Stable chest. Cardiac enlargement. Probable small bilateral pleural effusions with basilar atelectasis or infiltration. Electronically Signed   By: Burman Nieves M.D.   On: 09/12/2020 06:12   DG Abd 1 View  Result Date: 09/12/2020 CLINICAL DATA:  OG tube placement EXAM: ABDOMEN - 1 VIEW COMPARISON:  09/07/2020 FINDINGS: Enteric tube tip is in the left mid abdomen consistent with location in the body of the stomach. No change in position. Catheter is projected over the pelvis likely represent bilateral iliac vein catheters. Scattered gas and stool in the colon. No small or large bowel distention. IMPRESSION: Enteric tube tip is in the left mid abdomen consistent with location in the body of the stomach.  Electronically Signed   By: Burman Nieves M.D.   On:  09/12/2020 06:13   CT HEAD WO CONTRAST  Result Date: 09/12/2020 CLINICAL DATA:  Mental status change EXAM: CT HEAD WITHOUT CONTRAST TECHNIQUE: Contiguous axial images were obtained from the base of the skull through the vertex without intravenous contrast. COMPARISON:  09/07/2020 head CT. FINDINGS: Brain: New focus of intraparenchymal hemorrhage involving the left cerebellum and vermis measuring 4.8 x 3.5 x 2.4 cm (5:54, 3:12). Subarachnoid hemorrhage overlies the bilateral cerebellar folia. Subdural hemorrhage also tracks along the inferior tentorium. Intraventricular extension of hemorrhage involving the third and fourth ventricles. New inferior descent of the cerebellar tonsils, concerning for herniation. Complete effacement of the fourth ventricle. New mild lateral and third ventricular dilatation. Diffuse cerebral edema with blurring of the gray-white junctions. No midline shift. There may be a small 3 mm low-density extra-axial collection overlying the right frontal convexity versus artifact (3:19). Vascular: Diffusely increased density of the dural venous sinuses is unchanged. Skull: No acute osseous abnormality. Sinuses/Orbits: Normal orbits. Clear paranasal sinuses. No mastoid effusion. Other: None. IMPRESSION: 4.8 cm left cerebellum/vermis intraparenchymal hemorrhage with third and fourth intraventricular extension. Effacement of the fourth ventricle with new mild lateral/third ventriculomegaly and cerebellar tonsillar descent. Diffuse cerebral edema. 3 mm low-density right frontal extra-axial collection versus artifact. These results were called by telephone at the time of interpretation on 09/12/2020 at 9:05 am to provider GRACE BOWSER , who verbally acknowledged these results. Electronically Signed   By: Stana Bunting M.D.   On: 09/12/2020 09:18   DG CHEST PORT 1 VIEW  Result Date: 09/10/2020 CLINICAL DATA:  Check central line  placement EXAM: PORTABLE CHEST 1 VIEW COMPARISON:  Film from earlier in the same day. FINDINGS: Endotracheal tube and gastric catheter are noted in satisfactory position. Right jugular central line is noted with the tip at the cavoatrial junction. No pneumothorax is seen. Extensive aortic stent graft is noted somewhat extended inferiorly when compared with the previous exam. Increased density is noted over the thorax bilaterally consistent with posteriorly layering effusions. Mild basilar atelectasis is noted stable from the prior CT. IMPRESSION: No pneumothorax following central line placement. Tubes and lines as described. Inferior extension of the aortic stent graft when compared with the prior exam. Bilateral pleural effusions and associated atelectatic changes. Electronically Signed   By: Alcide Clever M.D.   On: 09/09/2020 19:39   DG CHEST PORT 1 VIEW  Result Date: 09/05/2020 CLINICAL DATA:  Status post repositioning of endotracheal tube. EXAM: PORTABLE CHEST 1 VIEW COMPARISON:  Single-view of the chest 09/24/2020 at 11:13 a.m. FINDINGS: The patient's endotracheal tube has been pulled back and its tip is now approximately 1.5 cm above the carina. No other change. IMPRESSION: Endotracheal tube tip now projects 1.5 cm above the carina. No other change. Electronically Signed   By: Drusilla Kanner M.D.   On: 08/25/2020 11:35   DG CHEST PORT 1 VIEW  Result Date: 09/08/2020 CLINICAL DATA:  Status post intubation. EXAM: PORTABLE CHEST 1 VIEW COMPARISON:  Single-view of the chest 09/10/2020. FINDINGS: Endotracheal tube tip is in the right mainstem bronchus. Recommend withdrawal of 2-3 cm. Hazy opacity in the right lower chest is new since yesterday's examination could be due to atelectasis or pleural fluid. Left lung is clear. No pneumothorax. Aortic stent graft noted. IMPRESSION: ET tube tip is within right mainstem bronchus. Recommend withdrawal of 2-3 cm. New right basilar opacity could be due to  atelectasis and/or effusion. Electronically Signed   By: Drusilla Kanner M.D.   On: 09/10/2020 11:34   DG  Chest Port 1 View  Result Date: 09/10/2020 CLINICAL DATA:  Stent EXAM: PORTABLE CHEST 1 VIEW COMPARISON:  None. FINDINGS: There is an aortic stent graft now placed. There is no focal airspace consolidation. There is atelectasis at the left lung base. Mild cardiomegaly. IMPRESSION: Status post aortic stent graft placement. No focal airspace disease. Electronically Signed   By: Deatra Robinson M.D.   On: 09/10/2020 02:52   DG Chest Port 1 View  Result Date: 08/26/2020 CLINICAL DATA:  Chest pain EXAM: PORTABLE CHEST 1 VIEW COMPARISON:  09/18/2020 FINDINGS: Heart is borderline in size. Lungs clear. No focal shadows project over both lower lungs. No effusions. No acute bony abnormality. IMPRESSION: Borderline heart size.  No active disease. Electronically Signed   By: Charlett Nose M.D.   On: 09/05/2020 16:27   DG Abd Portable 1V  Result Date: 21-Sep-2020 CLINICAL DATA:  NG tube placement EXAM: PORTABLE ABDOMEN - 1 VIEW COMPARISON:  None. FINDINGS: Enteric tube terminates within the stomach. Bowel gas pattern is unremarkable on this single view. A aortic stent graft is seen. IMPRESSION: Enteric tube terminates within the stomach. Electronically Signed   By: Guadlupe Spanish M.D.   On: September 21, 2020 13:22   Overnight EEG with video  Result Date: 09/12/2020 Charlsie Quest, MD     08/31/2020 11:34 AM Patient Name: Glenda Fuller MRN: [id number] Epilepsy Attending: Charlsie Quest Referring Physician/Provider: Graceann Congress, PA Duration: 09/21/2020 2048 to 10/90/21 1143 Patient history: 34year old female with history of hypertension who initially presented with back pain and found to have type B descending aortic dissection status post endovascular aorta repair who was noted to have acute onset of agitation, drooling, mild foaming as well as shaking/shivering concerning for seizures.  EEG to evaluate for  seizures. Level of alertness: comatose AEDs during EEG study: Keppra Technical aspects: This EEG study was done with scalp electrodes positioned according to the 10-20 International system of electrode placement. Electrical activity was acquired at a sampling rate of 500Hz  and reviewed with a high frequency filter of 70Hz  and a low frequency filter of 1Hz . EEG data were recorded continuously and digitally stored. Description: EEG showed continuous generalized 3 to 6 Hz theta-delta slowing. Around 5am on 09/12/2020 EEG became diffusely attenuated. Hyperventilation and photic stimulation were not performed.   ABNORMALITY - Continuous slow, generalized - Background attenuation, generalized IMPRESSION: This study was initially suggestive of severe diffuse encephalopathy. After 5am on 10/13/2020, eeg showed profound diffuse encephalopathy nonspecific etiology. No seizures or epileptiform discharges were seen throughout the recording.   CT Renal Stone Study  Result Date: 08/29/2020 CLINICAL DATA:  Acute renal failure with decreased hemoglobin. EXAM: CT ABDOMEN AND PELVIS WITHOUT CONTRAST TECHNIQUE: Multidetector CT imaging of the abdomen and pelvis was performed following the standard protocol without IV contrast. COMPARISON:  None. FINDINGS: Lower chest: Ill-defined high-density in the lower mediastinum. Small pleural effusions and subtle interlobular septal thickening at the bases. Hypertrophic appearance of the left ventricle. In the lower aorta there is a ring like high-density structures with rotating appearance and apparent tethering at the level of the celiac origin. This continues to the level of at least the aortic bifurcation. Hepatobiliary: No focal liver abnormality.No evidence of biliary obstruction or calcified stone. Pancreas: Unremarkable. Spleen: Unremarkable. Adrenals/Urinary Tract: Negative adrenals. Symmetric renal atrophy. 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 Unremarkable bladder. Stomach/Bowel:  No obstruction. No appendicitis. Vascular/Lymphatic: No acute vascular abnormality. No mass or adenopathy. Reproductive:Posterior intramural fibroid  appearance measuring 2.5 cm Other: Nonspecific trace pelvic fluid. Musculoskeletal: No acute abnormalities. Critical Value/emergent results were called by telephone at the time of interpretation on 09/08/2020 at 6:55 pm to provider [facility] , who verbally acknowledged these results. The patient's creatinine is 10. We discussed doing a point of care abdominal ultrasound to confirm flap, and obtaining an emergent chest CT. IMPRESSION: 1. Suspect aortic dissection originating in the chest, with a minimally covered mediastinal hematoma. Recommend emergent chest CT. 2. 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 3. Small bilateral pleural effusion and probable mild interstitial pulmonary edema. Electronically Signed   By: Marnee Spring M.D.   On: 09/07/2020 19:02   ECHO INTRAOPERATIVE TEE  Result Date: 09/10/2020  *INTRAOPERATIVE TRANSESOPHAGEAL REPORT *  Patient Name:   Glenda Fuller Date of Exam: 09/12/2020 Medical Rec #:  [id number]      Height:       60.0 in Accession #:    [id number]     Weight:       86.1 lb Date of Birth:  1986/06/10      BSA:          1.30 m Patient Age:    34 years       BP:           118/56 mmHg Patient Gender: F              HR:           68 bpm. Exam Location:  Inpatient Transesophogeal exam was perform intraoperatively during surgical procedure. Patient was closely monitored under general anesthesia during the entirety of examination. Indications:     Aortic Dissection Performing Phys: [id number] [address] JACKSON Diagnosing Phys: Jairo Ben MD Report CC'd to:  Clotilde Dieter Complications: No known complications during this procedure. PRE-OP FINDINGS  Left Ventricle: The left ventricle has hyperdynamic systolic  function, with an ejection fraction of >65%. The cavity size was decreased. There is severely increased left ventricular wall thickness, measuring 2.0 cm. No evidence of left ventricular regional wall motion abnormalities. Right Ventricle: The right ventricle has normal systolic function. The cavity was small. There is increased right ventricular wall thickness. Left Atrium: Left atrial size was normal in size. The left atrial appendage is well visualized and there is no evidence of thrombus present. Left atrial appendage velocity is normal at greater than 40 cm/s. Right Atrium: Right atrial size was normal in size. Interatrial Septum: No atrial level shunt detected by color flow Doppler. Pericardium: A small pericardial effusion is present. Mitral Valve: The mitral valve is normal in structure. No thickening of the mitral valve leaflets. No calcification of the mitral valve leaflets. Mitral valve regurgitation is trivial by color flow Doppler. There is no evidence of mitral valve vegetation. Pulmonary venous flow is normal. There is no evidence of mitral stenosis. Tricuspid Valve: The tricuspid valve was normal in structure. Tricuspid valve regurgitation is mild by color flow Doppler. The jet is directed toward the atrial septum. There is no evidence of tricuspid valve vegetation. Aortic Valve: The aortic valve is tricuspid, with normal leaflet excursion. Aortic valve regurgitation is trivial by color flow Doppler. There is no evidence of aortic valve stenosis. There is no evidence of a vegetation on the aortic valve. Pulmonic Valve: The pulmonic valve was normal in structure, with normal leaflet excursion. No evidence of pulmonic stenosis. Pulmonic valve regurgitation is trivial by color flow Doppler. Aorta: The ascending aorta and aortic arch  appear normal in size and. There is a dissection of the entire descending aorta, extending from the arch and continuing distally beyond the limitation of TEE visualization.  A false lumen can be seen that contains thrombus. There is an interventional wire present in the true lumen of the aorta, extending into the Sinus of Valsalva. This is causing an artifactual appearance of continuation of the dissection into the ascending aorta, however with removal of  the wire, the ascending aorta and aortic root appear normal. Pulmonary Artery: The pulmonary artery is of normal size. Venous: The inferior vena cava was not well visualized. +--------------+-------++ LEFT VENTRICLE        +--------------+-------++ PLAX 2D               +--------------+-------++ LVIDd:        2.00 cm +--------------+-------++                       +--------------+-------++  +-------------+-------++ AORTA                +-------------+-------++ Ao Root diam:3.00 cm +-------------+-------++  Jairo Ben MD Electronically signed by Jairo Ben MD Signature Date/Time: 09/10/2020/2:19:50 AM    Final    CT Angio Chest/Abd/Pel for Dissection W and/or W/WO  Result Date: 09/02/2020 CLINICAL DATA:  None none EXAM: CT ANGIOGRAPHY CHEST, ABDOMEN AND PELVIS TECHNIQUE: Non-contrast CT of the chest was initially obtained. Multidetector CT imaging through the chest, abdomen and pelvis was performed using the standard protocol during bolus administration of intravenous contrast. Multiplanar reconstructed images and MIPs were obtained and reviewed to evaluate the vascular anatomy. CONTRAST:  OMNIPAQUE IOHEXOL 350 MG/ML SOLN COMPARISON:  October 01, 2020 FINDINGS: CTA CHEST FINDINGS Cardiovascular: Since the prior examination, stent graft repair of the type B aortic dissection has been performed with a endoluminal stent graft extending from the aortic arch just beyond the takeoff of the left subclavian artery to the diaphragmatic hiatus. The false lumen has thrombosed there is wide patency of the stented true lumen of the descending thoracic aorta. The ascending aorta is unremarkable. Conventional  arch vessel anatomy with wide patency of the arch vasculature proximally. Mild cardiomegaly with marked left ventricular hypertrophy again noted. The coronary arteries are widely patent proximally the aortic valve is trileaflet. No pericardial effusion. Central pulmonary arteries are of normal caliber. Mediastinum/Nodes: No pathologic thoracic adenopathy. Thyroid unremarkable. Mediastinal hematoma again noted, grossly stable from prior examination. Lungs/Pleura: Endotracheal tube is seen with its tip at the carina oriented toward the right mainstem bronchus. Large bilateral pleural effusions are present, enlarged since prior examination with associated complete collapse of the right lower lobe and near complete collapse of the left lower lobe. No pneumothorax. No central obstructing lesion. Musculoskeletal: Epidural catheter is seen with its tip at the level of T7-8. No acute bone abnormality. Review of the MIP images confirms the above findings. CTA ABDOMEN AND PELVIS FINDINGS VASCULAR Aorta: The aortic dissection continues in similar configuration of prior examination with the left gastric artery and right phrenic artery arising from the false lumen, the celiac axis, which is comprised of the common hepatic artery and splenic artery, arising from the true lumen, as well as the superior mesenteric artery, inferior mesenteric artery, and renal arteries bilaterally arising from the true lumen. There is an excrescence of contrast representing a continuation of the false lumen along the left lateral border of the juxtarenal aorta compatible with a contained leak. There is a tiny focus of hyperdensity seen external  to the aorta at this level compatible with rupture and extraluminal hemorrhage. This is best seen on axial images 127-132/6. The dissection terminates within the infrarenal aorta with the common iliac arteries arising from the true lumen. No aneurysm. There is wide patency of the mesenteric and renal  vasculature. Celiac: See above. Variant anatomy. Left gastric artery arises from the false lumen. Common hepatic artery and splenic artery form the true celiac axis and arise from the true lumen and are widely patent. SMA: Arises from the true lumen and is widely patent. No evidence of distal embolization. Renals: Arise from the true lumen and are widely patent. IMA: Arise from the true lumen and is widely patent. Inflow: Arise from the true lumen and are widely patent. Internal iliac arteries are patent bilaterally. Veins: Not well opacified. Review of the MIP images confirms the above findings. NON-VASCULAR Hepatobiliary: Vicarious excretion of contrast within the gallbladder lumen. Liver unremarkable. Pancreas: Not well opacified due to retroperitoneal hematoma and phase of contrast administration. Spleen: Nonenhancing though the splenic artery is patent distally suggesting that this relates to early phase of contrast enhancement. Adrenals/Urinary Tract: As noted previously, the kidneys are markedly atrophic bilaterally. Hyperdensity of the renal cortices represents residual contrast from prior examination and may simply represent physiologic retention though changes of contrast induced nephropathy could appear similarly. No hydronephrosis. The bladder is decompressed with a Foley catheter balloon seen within its lumen. Stomach/Bowel: There is moderate fluid within the abdomen which is uniformly hyperdense and is compatible with moderate hemoperitoneum. The mild ascites was present on prior examination, the increased density and increasing volume of fluid suggests that the hemoperitoneum represents an interval process. Bowel unremarkable. Lymphatic: The retroperitoneal lymph nodes are obscured by an increasing retroperitoneal hemorrhage which now obliterates the fat plane surrounding the abdominal aorta within the retroperitoneum. Reproductive: Unremarkable Other: Increasing diffuse subcutaneous edema is present  in keeping with progressive anasarca Musculoskeletal: No acute bone abnormality Review of the MIP images confirms the above findings. IMPRESSION: Interval endovascular repair of a type B aortic dissection within the thorax with thrombosis of the false lumen and wide patency of the stented true lumen. Stable appearance of type B aortic dissection within the abdominal aorta. Evidence of rupture involving the left lateral aspect of the juxtarenal abdominal aorta. Increasing retroperitoneal hemorrhage is present as is new moderate hemoperitoneum. Marked atrophy of the kidneys again noted. Superimposed cortical hyperdensity represents retained contrast and may be physiologic or the sequela of contrast induced nephropathy. Marked left ventricular hypertrophy again noted. Progressive anasarca now with large bilateral pleural effusions and resultant bibasilar compressive atelectasis and diffuse body wall subcutaneous edema. These results were called by telephone at the time of interpretation on 09/23/2020 at 12:42 pm to provider Maxxon Schwanke , who verbally acknowledged these results. Electronically Signed   By: Helyn Numbers MD   On: 09/06/2020 12:44   CT Angio Chest/Abd/Pel for Dissection W and/or Wo Contrast  Result Date: 2020/09/28 CLINICAL DATA:  Left-sided back pain EXAM: CT ANGIOGRAPHY CHEST, ABDOMEN AND PELVIS TECHNIQUE: Non-contrast CT of the chest was initially obtained. Multidetector CT imaging through the chest, abdomen and pelvis was performed using the standard protocol during bolus administration of intravenous contrast. Multiplanar reconstructed images and MIPs were obtained and reviewed to evaluate the vascular anatomy. CONTRAST:  OMNIPAQUE IOHEXOL 350 MG/ML SOLN COMPARISON:  None. FINDINGS: CTA CHEST FINDINGS Cardiovascular: Moderate hematoma extending from the isthmus of the aorta inferiorly to nearly the diaphragm. Marked left ventricular hypertrophy. No pericardial  effusion. Aortic dissection  beginning at the isthmus and continuing into the abdomen. The false lumen compresses the true lumen with equalized enhancement and size after a prominent fenestration near the hiatus. The coronary arteries are enhancing, better seen on the left. Mediastinum/Nodes: Hematoma as noted above. Lungs/Pleura: Small pleural effusions and trace interlobular septal thickening at the bases. Musculoskeletal: No acute finding Review of the MIP images confirms the above findings. CTA ABDOMEN AND PELVIS FINDINGS VASCULAR Aortic dissection with equalized enhancement of the true and false lumens. A wide fenestration is seen at the hiatus. The true lumen serves the celiac axis, SMA, and bilateral renal arteries. Dissection terminates at the level of the aortic bifurcation. No downstream emboli are seen in the major visceral branches. Review of the MIP images confirms the above findings. NON-VASCULAR Hepatobiliary: No focal liver abnormality.No evidence of biliary obstruction or stone. Pancreas: Unremarkable. Spleen: Unremarkable. Adrenals/Urinary Tract: Negative adrenals. No hydronephrosis or stone. Symmetric renal atrophy unremarkable bladder. Stomach/Bowel:  No evidence of bowel ischemia. Lymphatic: No mass or adenopathy. Reproductive:Fundal fibroid appearance measuring approximately 2 cm. Other: Trace pelvic fluid. Musculoskeletal: No acute abnormalities. Review of the MIP images confirms the above findings. Critical Value/emergent results were called by telephone at the time of interpretation on 09/10/2020 at 7:23 pm to provider DAN FLOYD , who verbally acknowledged these results. IMPRESSION: 1. 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 No ascending aortic involvement. 2. Notable compression of the true lumen until a large fenestration near the hiatus where there is also equalized enhancement of the lumens. The dissection terminates by the aortic bifurcation.  3. No downstream emboli or organ ischemia noted. 4. Marked left ventricular hypertrophy. 5. Symmetric renal atrophy. Electronically Signed   By: Marnee Spring M.D.   On: 08/31/2020 19:31   CT HEAD CODE STROKE WO CONTRAST  Result Date: 09/06/2020 CLINICAL DATA:  Code stroke.  Acute neuro deficit.  Aphasia. EXAM: CT HEAD WITHOUT CONTRAST TECHNIQUE: Contiguous axial images were obtained from the base of the skull through the vertex without intravenous contrast. COMPARISON:  None. FINDINGS: Brain: Ventricle size normal.  No acute infarct or mass. There is increased density and thickening of the tentorium bilaterally. The falx also appears hyperdense and mildly thickened. No subarachnoid hemorrhage identified. Vascular: Vessels are diffusely hyperdense including arteries and veins. No asymmetric density of the circle-of-Willis. There is question hypodensity within the transverse and sigmoid sinus bilaterally raising the possibility of venous sinus thrombosis. Skull: Negative Sinuses/Orbits: Paranasal sinuses clear. Mastoid sinus clear. Negative orbit. Other: None ASPECTS (Alberta Stroke Program Early CT Score) - Ganglionic level infarction (caudate, lentiform nuclei, internal capsule, insula, M1-M3 cortex): 7 - Supraganglionic infarction (M4-M6 cortex): 3 Total score (0-10 with 10 being normal): 10 IMPRESSION: 1. Negative for acute infarct. 2. Diffuse increased density of the dural venous sinuses. There is also thickening and increased density of the tentorium bilaterally and the falx. The patient has a lumbar drain and low intracranial pressure could be the cause of venous congestion. Question hypodensity in the transverse and sigmoid sinus bilaterally which could represent venous sinus thrombosis and venous congestion of the falx and tentorium. 3. ASPECTS is 10 4. These results were called by telephone at the time of interpretation on 09/12/2020 at 12:00 pm to provider Roda Shutters , who verbally acknowledged these  results. Electronically Signed   By: Marlan Palau M.D.   On: 09/06/2020 12:00   HYBRID OR IMAGING ([facility] ONLY)  Result Date: 09/21/2020 There is no  interpretation for this exam.  This order is for images obtained during a surgical procedure.  Please See "Surgeries" Tab for more information regarding the procedure.   HYBRID OR IMAGING ([facility] ONLY)  Result Date: 08/31/2020 There is no interpretation for this exam.  This order is for images obtained during a surgical procedure.  Please See "Surgeries" Tab for more information regarding the procedure.    Microbiology Recent Results (from the past 240 hour(s))  Respiratory Panel by RT PCR (Flu A&B, Covid) - Nasopharyngeal Swab     Status: None   Collection Time: 09/12/2020  3:34 PM   Specimen: Nasopharyngeal Swab  Result Value Ref Range Status   SARS Coronavirus 2 by RT PCR NEGATIVE NEGATIVE Final    Comment: (NOTE) SARS-CoV-2 target nucleic acids are NOT DETECTED.  The SARS-CoV-2 RNA is generally detectable in upper respiratoy specimens during the acute phase of infection. The lowest concentration of SARS-CoV-2 viral copies this assay can detect is 131 copies/mL. A negative result does not preclude SARS-Cov-2 infection and should not be used as the sole basis for treatment or other patient management decisions. A negative result may occur with  improper specimen collection/handling, submission of specimen other than nasopharyngeal swab, presence of viral mutation(s) within the areas targeted by this assay, and inadequate number of viral copies (<131 copies/mL). A negative result must be combined with clinical observations, patient history, and epidemiological information. The expected result is Negative.  Fact Sheet for Patients:  https://www.moore.com/  Fact Sheet for Healthcare Providers:  https://www.young.biz/  This test is no t yet approved or cleared by the [place] FDA and  has been  authorized for detection and/or diagnosis of SARS-CoV-2 by FDA under an Emergency Use Authorization (EUA). This EUA will remain  in effect (meaning this test can be used) for the duration of the COVID-19 declaration under Section 564(b)(1) of the Act, 21 U.S.C. section 360bbb-3(b)(1), unless the authorization is terminated or revoked sooner.     Influenza A by PCR NEGATIVE NEGATIVE Final   Influenza B by PCR NEGATIVE NEGATIVE Final    Comment: (NOTE) The Xpert Xpress SARS-CoV-2/FLU/RSV assay is intended as an aid in  the diagnosis of influenza from Nasopharyngeal swab specimens and  should not be used as a sole basis for treatment. Nasal washings and  aspirates are unacceptable for Xpert Xpress SARS-CoV-2/FLU/RSV  testing.  Fact Sheet for Patients: https://www.moore.com/  Fact Sheet for Healthcare Providers: https://www.young.biz/  This test is not yet approved or cleared by the [place] FDA and  has been authorized for detection and/or diagnosis of SARS-CoV-2 by  FDA under an Emergency Use Authorization (EUA). This EUA will remain  in effect (meaning this test can be used) for the duration of the  Covid-19 declaration under Section 564(b)(1) of the Act, 21  U.S.C. section 360bbb-3(b)(1), unless the authorization is  terminated or revoked. Performed at [facility], [address] [place], [place] [address]   MRSA PCR Screening     Status: None   Collection Time: 09/10/20  3:09 AM   Specimen: Nasopharyngeal  Result Value Ref Range Status   MRSA by PCR NEGATIVE NEGATIVE Final    Comment:        The GeneXpert MRSA Assay (FDA approved for NASAL specimens only), is one component of a comprehensive MRSA colonization surveillance program. It is not intended to diagnose MRSA infection nor to guide or monitor treatment for MRSA infections. Performed at [facility], [address] [place], [place] [address]    Culture, blood (routine x 2)     Status: None   Collection Time: 09/12/20  9:40 AM   Specimen: BLOOD LEFT HAND  Result Value Ref Range Status   Specimen Description BLOOD LEFT HAND  Final   Special Requests   Final    BOTTLES DRAWN AEROBIC ONLY Blood Culture results may not be optimal due to an inadequate volume of blood received in culture bottles   Culture   Final    NO GROWTH 5 DAYS Performed at [facility], [address], [place], [place] [address]    Report Status 09/17/2020 FINAL  Final  Culture, blood (routine x 2)     Status: None   Collection Time: 09/12/20  9:47 AM   Specimen: BLOOD  Result Value Ref Range Status   Specimen Description BLOOD WRIST LEFT  Final   Special Requests   Final    BOTTLES DRAWN AEROBIC ONLY Blood Culture results may not be optimal due to an inadequate volume of blood received in culture bottles   Culture   Final    NO GROWTH 5 DAYS Performed at [facility], [address], [place], [place] [address]    Report Status 09/17/2020 FINAL  Final  Culture, Urine     Status: None   Collection Time: 09/12/20  6:30 PM   Specimen: Urine, Random  Result Value Ref Range Status   Specimen Description URINE, RANDOM  Final   Special Requests NONE  Final   Culture   Final    NO GROWTH Performed at [facility], [address], [place], [place] [address]    Report Status 09/14/2020 FINAL  Final  Respiratory Panel by RT PCR (Flu A&B, Covid) - Nasopharyngeal Swab     Status: None   Collection Time: 09/12/20 11:36 PM   Specimen: Nasopharyngeal Swab  Result Value Ref Range Status   SARS Coronavirus 2 by RT PCR NEGATIVE NEGATIVE Final    Comment: (NOTE) SARS-CoV-2 target nucleic acids are NOT DETECTED.  The SARS-CoV-2 RNA is generally detectable in upper respiratoy specimens during the acute phase of infection. The lowest concentration of SARS-CoV-2 viral copies this assay can detect is 131 copies/mL. A negative result does not preclude  SARS-Cov-2 infection and should not be used as the sole basis for treatment or other patient management decisions. A negative result may occur with  improper specimen collection/handling, submission of specimen other than nasopharyngeal swab, presence of viral mutation(s) within the areas targeted by this assay, and inadequate number of viral copies (<131 copies/mL). A negative result must be combined with clinical observations, patient history, and epidemiological information. The expected result is Negative.  Fact Sheet for Patients:  https://www.moore.com/  Fact Sheet for Healthcare Providers:  https://www.young.biz/  This test is no t yet approved or cleared by the [place] FDA and  has been authorized for detection and/or diagnosis of SARS-CoV-2 by FDA under an Emergency Use Authorization (EUA). This EUA will remain  in effect (meaning this test can be used) for the duration of the COVID-19 declaration under Section 564(b)(1) of the Act, 21 U.S.C. section 360bbb-3(b)(1), unless the authorization is terminated or revoked sooner.     Influenza A by PCR NEGATIVE NEGATIVE Final   Influenza B by PCR NEGATIVE NEGATIVE Final    Comment: (NOTE) The Xpert Xpress SARS-CoV-2/FLU/RSV assay is intended as an aid in  the diagnosis of influenza from Nasopharyngeal swab specimens and  should not be used as a sole basis for treatment. Nasal washings and  aspirates are unacceptable for Xpert Xpress SARS-CoV-2/FLU/RSV  testing.  Fact Sheet for Patients: https://www.moore.com/https://www.fda.gov/media/142436/download  Fact Sheet for Healthcare Providers: https://www.young.biz/https://www.fda.gov/media/142435/download  This test is not yet approved or cleared by the [place] FDA and  has been authorized for detection and/or diagnosis of SARS-CoV-2 by  FDA under an Emergency Use Authorization (EUA). This EUA will remain  in effect (meaning this test can be used) for the duration of the   Covid-19 declaration under Section 564(b)(1) of the Act, 21  U.S.C. section 360bbb-3(b)(1), unless the authorization is  terminated or revoked. Performed at [facility], [address] [place], [place] [address]     Lab Basic Metabolic Panel: Recent Labs  Lab February 29, 2020 1309 February 29, 2020 1547 February 29, 2020 1834 February 29, 2020 1835 09/12/20 0246 09/12/20 1800 09/12/20 1830 09/12/20 1834 09/06/2020 0427 09/14/2020 0447 09/08/2020 0913  NA 129*   < > 131*   < > UNABLE TO REPORT DUE TO LIPEMIC INTERFERENCE   < > 121* 125* 126* 125* 127*  K 3.7   < > UNABLE TO REPORT DUE TO HEMOLYSIS   < > UNABLE TO REPORT DUE TO LIPEMIC INTERFERENCE   < > 4.5 4.2 4.2 4.2 4.2  CL 99  --  98  --  101  --  95*  --   --  94*  --   CO2 12*  --  12*  --  13*  --  12*  --   --  16*  --   GLUCOSE 102*  --  70  --  81  --  357*  --   --  225*  --   BUN 94*  --  94*  --  67*  --  59*  --   --  58*  --   CREATININE 7.70*  --  7.63*  --  5.47*  --  5.03*  --   --  5.06*  --   CALCIUM 6.5*  --  5.7*  --  6.0*  --  5.7*  --   --  6.4*  --   MG  --   --  2.0  --  2.2  --  1.7  --   --   --   --   PHOS  --   --  9.1*  --  UNABLE TO REPORT DUE TO LIPEMIC INTERFERENCE  --  6.3*  --   --   --   --    < > = values in this interval not displayed.   Liver Function Tests: Recent Labs  Lab February 29, 2020 1309 February 29, 2020 1834 09/12/20 0246 09/12/20 1830 09/12/2020 0447  AST 68*  --   --  54* 40  ALT 23  --   --  12 10  ALKPHOS 86  --   --  86 101  BILITOT 1.1  --   --  0.7 0.5  PROT 4.5*  --   --  3.7* 3.8*  ALBUMIN 2.1* 1.7* 1.8* 1.2* 1.2*   Recent Labs  Lab 09/12/20 1830  LIPASE 71*  AMYLASE 163*   No results for input(s): AMMONIA in the last 168 hours. CBC: Recent Labs  Lab February 29, 2020 2133 February 29, 2020 2133 09/12/20 0500 09/12/20 0500 09/12/20 1800 09/12/20 1830 09/12/20 1834 09/06/2020 0427 09/15/2020 0913  WBC 4.7  --  4.2  --   --  11.8*  --   --   --   NEUTROABS  --   --   --   --   --  11.1*  --   --   --   HGB 14.1    < > 11.7*   < > 12.9 12.5 13.3 11.9* 11.6*  HCT 41.7   < > 34.8*   < > 38.0 36.4 39.0 35.0* 34.0*  MCV 80.2  --  80.6  --   --  77.3*  --   --   --   PLT 264  --  243  --   --  150  --   --   --    < > = values in this interval not displayed.   Cardiac Enzymes: No results for input(s): CKTOTAL, CKMB, CKMBINDEX, TROPONINI in the last 168 hours. Sepsis Labs: Recent Labs  Lab 09/18/2020 2133 09/12/20 0500 09/12/20 1830  WBC 4.7 4.2 11.8*    Procedures/Operations  September 24, 2020 Endovascular aortic stent/graft 10/18 Intubation 10/18 Aortogram, decompressive ex-lap   Michal Callicott Mechele Collin 09/18/2020, 11:39 AM

## 2020-09-25 NOTE — Progress Notes (Signed)
eLink Physician-Brief Progress Note Patient Name: Jessel Gettinger DOB: [birth year] MRN: [id number]   Date of Service  Oct 10, 2020  HPI/Events of Note  Ca++ = 6.4 which corrects to 8.64 (Low) given albumin = 1.2.  eICU Interventions  Will replace Ca++.     Intervention Category Major Interventions: Electrolyte abnormality - evaluation and management  Omeka Holben Eugene 10-Oct-2020, 6:48 AM

## 2020-09-25 NOTE — Progress Notes (Signed)
[facility] notified of TOD. Requested hold on body to assess for tissue and eye donation. Bedside RN notified.

## 2020-09-25 DEATH — deceased

## 2021-03-18 IMAGING — CT CT HEAD W/O CM
4 series · 15 of 47 positions shown, 17 images · non-contrast
Comparison: 09/11/2020 head CT.

CLINICAL DATA: Mental status change

EXAM:
CT HEAD WITHOUT CONTRAST
TECHNIQUE: Contiguous axial images were obtained from the base of the skull
through the vertex without intravenous contrast.

[Series 3: head without · axial · non-contrast · 0.39mm/px · z∈[-326,-206]mm · 7 of 32 slices shown, 9 images]
[im 4/32  brain]
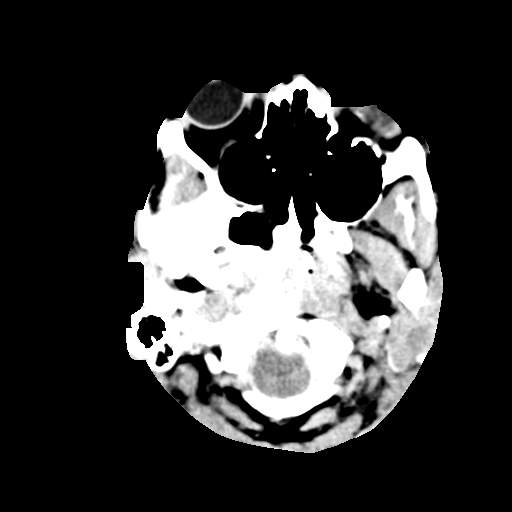
[im 4/32  bone]
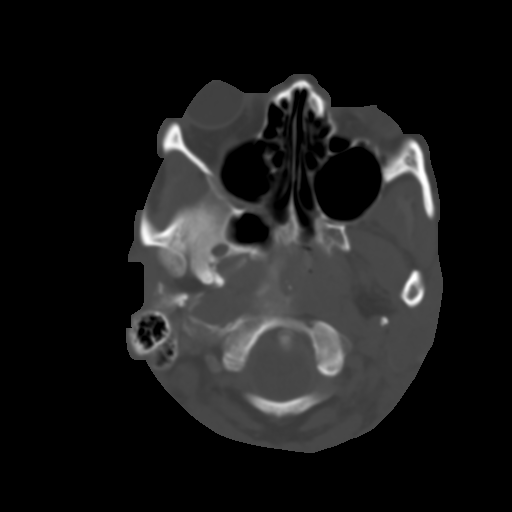
[im 8/32  brain]
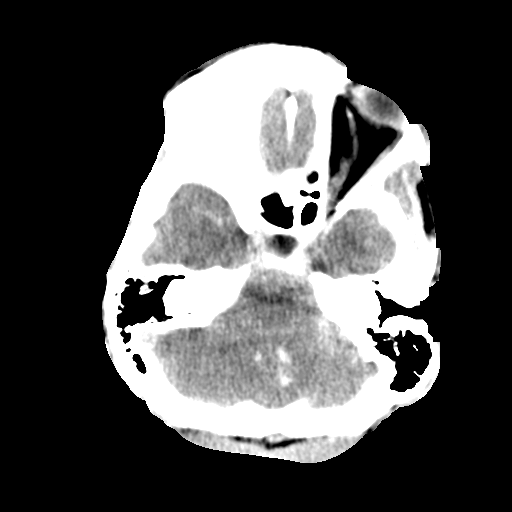
[im 12/32  brain]
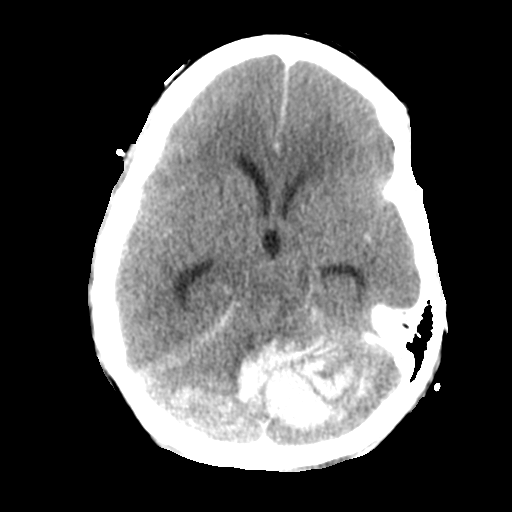
[im 16/32  brain]
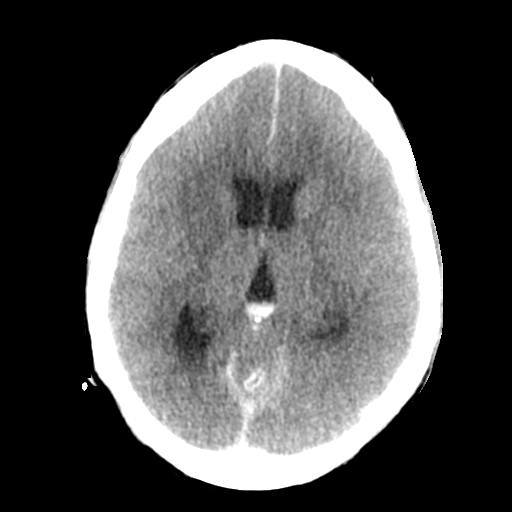
[im 20/32  brain]
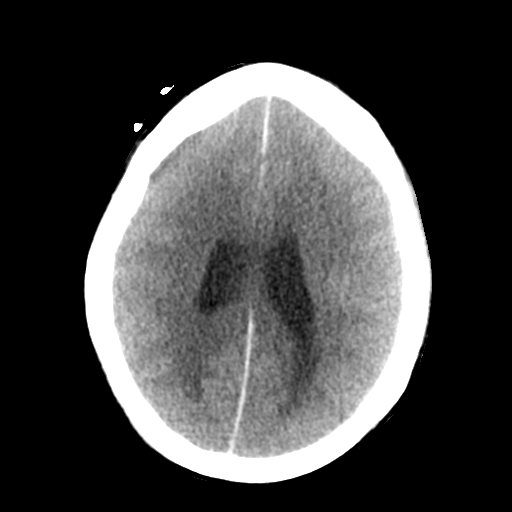
[im 20/32  bone]
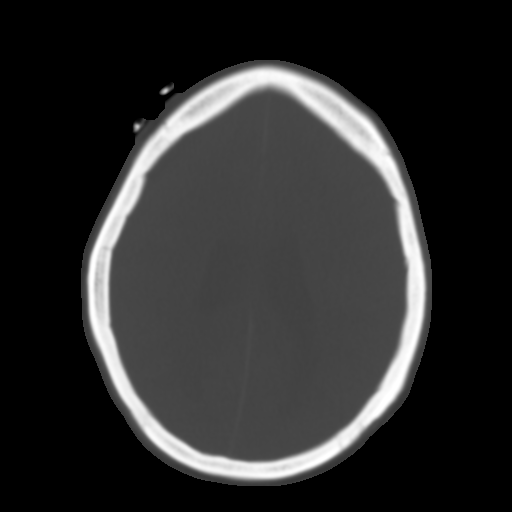
[im 24/32  brain]
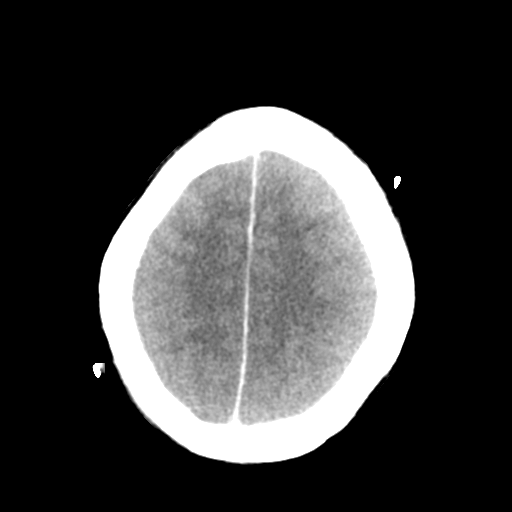
[im 28/32  brain]
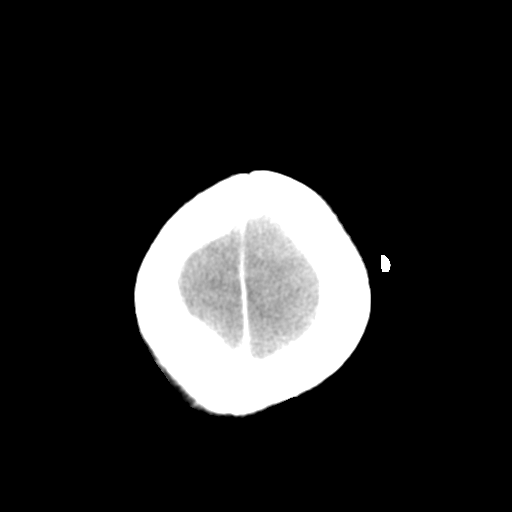

[Series 4: head bone · axial · 0.39mm/px · z∈[-328,-312]mm · 2 of 79 slices shown]
[im 8/79  bone]
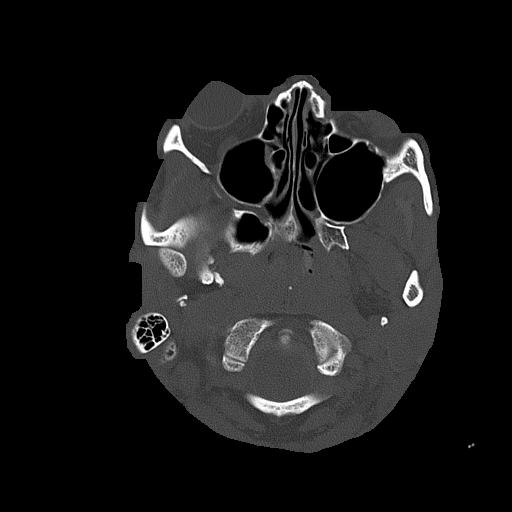
[im 16/79  bone]
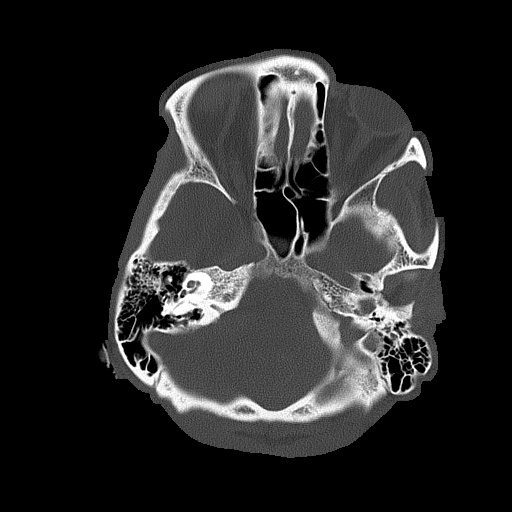

[Series 5: head without cor · coronal · non-contrast · 0.31mm/px · 3 of 67 slices shown]
[im 23/67  brain]
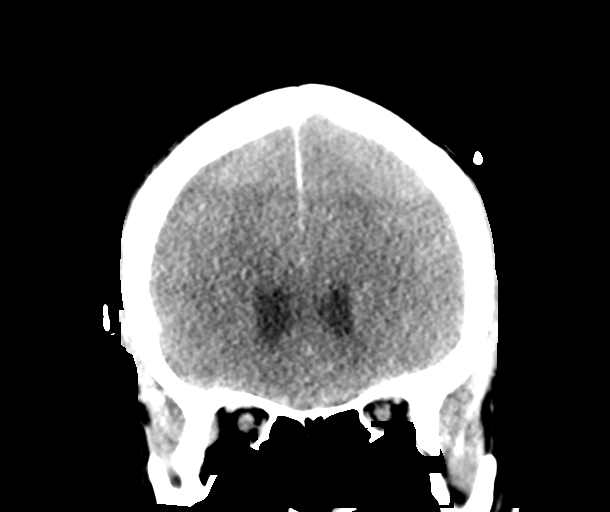
[im 30/67  brain]
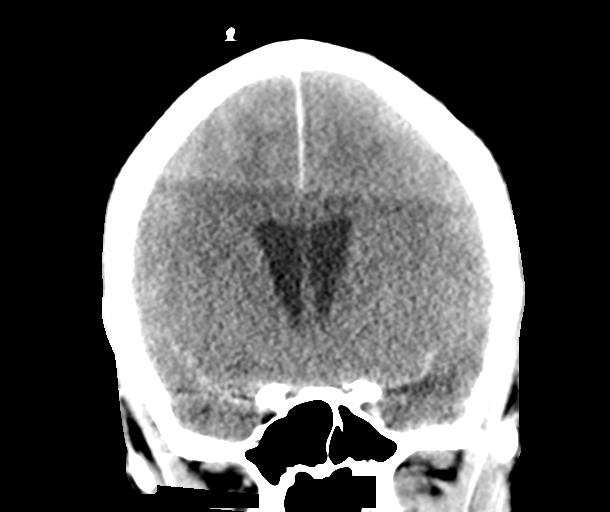
[im 37/67  brain]
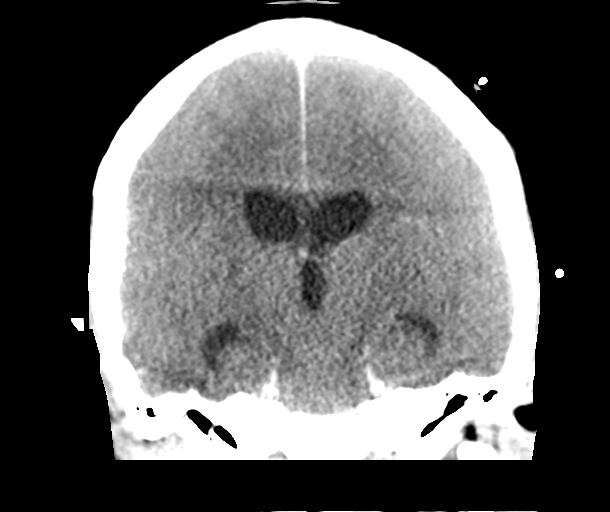

[Series 6: head without sag · sagittal · non-contrast · 0.31mm/px · 3 of 66 slices shown]
[im 22/66  brain]
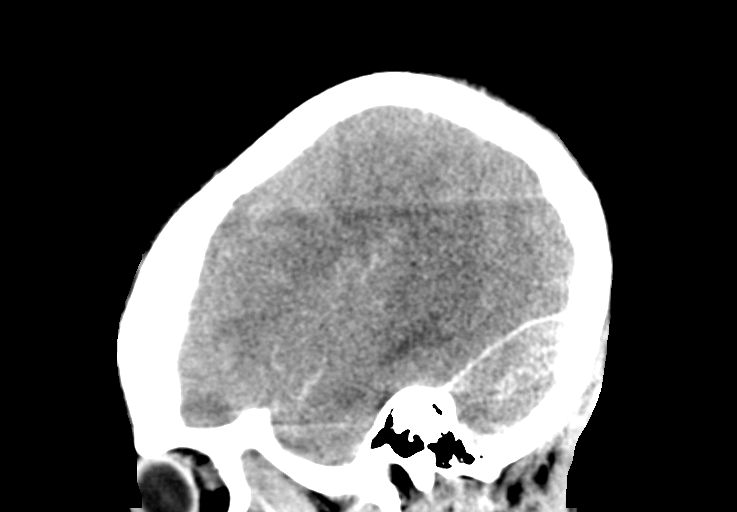
[im 33/66  brain]
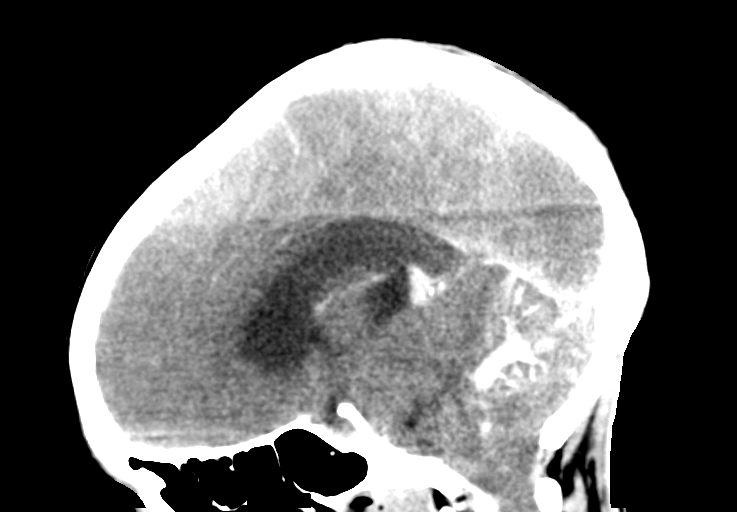
[im 44/66  brain]
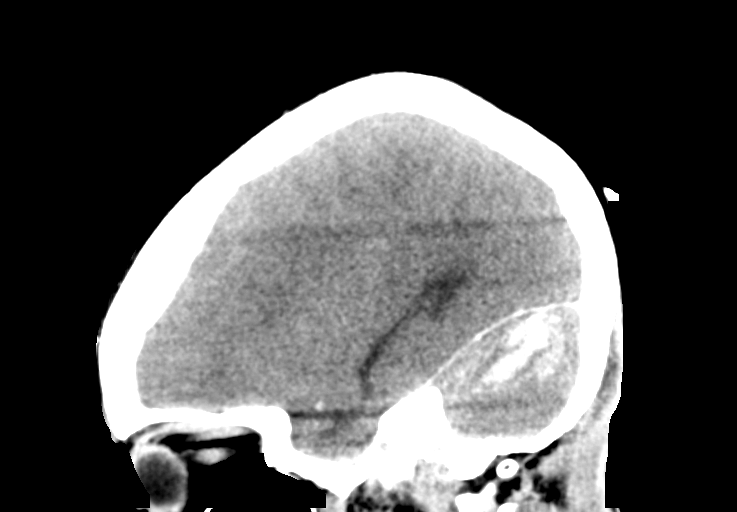

[15 of 47 positions shown; findings below may reference images not displayed]

FINDINGS: Brain: New focus of intraparenchymal hemorrhage involving the left
cerebellum and vermis measuring 4.8 x 3.5 x 2.4 cm ([DATE], [DATE]).
Subarachnoid hemorrhage overlies the bilateral cerebellar folia.
Subdural hemorrhage also tracks along the inferior tentorium.
Intraventricular extension of hemorrhage involving the third and
fourth ventricles. New inferior descent of the cerebellar tonsils,
concerning for herniation.

Complete effacement of the fourth ventricle. New mild lateral and
third ventricular dilatation. Diffuse cerebral edema with blurring
of the gray-white junctions. No midline shift.

There may be a small 3 mm low-density extra-axial collection
overlying the right frontal convexity versus artifact ([DATE]).

Vascular: Diffusely increased density of the dural venous sinuses is
unchanged.

Skull: No acute osseous abnormality.

Sinuses/Orbits: Normal orbits. Clear paranasal sinuses. No mastoid
effusion.

Other: None.
IMPRESSION: 4.8 cm left cerebellum/vermis intraparenchymal hemorrhage with third
and fourth intraventricular extension.

Effacement of the fourth ventricle with new mild lateral/third
ventriculomegaly and cerebellar tonsillar descent.

Diffuse cerebral edema.

3 mm low-density right frontal extra-axial collection versus
artifact.

These results were called by telephone at the time of interpretation
on 09/12/2020 at [DATE] to provider ISUYE NANNESTAD , who verbally
acknowledged these results.
